# Patient Record
Sex: Female | Born: 1995 | Race: Black or African American | Hispanic: No | Marital: Single | Smoking: Never smoker
Health system: Southern US, Community
[De-identification: ages and names within clinical notes are randomized; demographics above are authoritative.]

## PROBLEM LIST (undated history)

## (undated) DIAGNOSIS — R591 Generalized enlarged lymph nodes: Secondary | ICD-10-CM

## (undated) DIAGNOSIS — R590 Localized enlarged lymph nodes: Secondary | ICD-10-CM

## (undated) DIAGNOSIS — R011 Cardiac murmur, unspecified: Secondary | ICD-10-CM

## (undated) DIAGNOSIS — I10 Essential (primary) hypertension: Secondary | ICD-10-CM

## (undated) DIAGNOSIS — B019 Varicella without complication: Secondary | ICD-10-CM

## (undated) HISTORY — DX: Essential (primary) hypertension: I10

## (undated) HISTORY — DX: Varicella without complication: B01.9

---
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
## 2014-08-17 ENCOUNTER — Encounter (HOSPITAL_COMMUNITY): Payer: Self-pay | Admitting: Emergency Medicine

## 2014-08-17 ENCOUNTER — Emergency Department (HOSPITAL_COMMUNITY)
Admission: EM | Admit: 2014-08-17 | Discharge: 2014-08-17 | Disposition: A | Discharge status: Home / Self Care | Payer: Self-pay | Source: Home / Self Care | Attending: Family Medicine | Admitting: Family Medicine

## 2014-08-17 DIAGNOSIS — M542 Cervicalgia: Secondary | ICD-10-CM

## 2014-08-17 MED ORDER — DICLOFENAC SODIUM 1 % TD GEL: 2.0000 g | Freq: Four times a day (QID) | TRANSDERMAL | Status: DC
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
## 2014-08-17 NOTE — Discharge Instructions (Signed)

## 2015-10-20 ENCOUNTER — Ambulatory Visit (INDEPENDENT_AMBULATORY_CARE_PROVIDER_SITE_OTHER): Payer: BLUE CROSS/BLUE SHIELD | Admitting: Family Medicine

## 2015-10-20 ENCOUNTER — Encounter: Payer: Self-pay | Admitting: Family Medicine

## 2015-10-20 VITALS — BP 140/98 | HR 75 | Temp 98.5°F | Ht 65.0 in | Wt 204.0 lb

## 2015-10-20 DIAGNOSIS — R030 Elevated blood-pressure reading, without diagnosis of hypertension: Secondary | ICD-10-CM

## 2015-10-20 DIAGNOSIS — E669 Obesity, unspecified: Secondary | ICD-10-CM

## 2015-10-20 DIAGNOSIS — I10 Essential (primary) hypertension: Secondary | ICD-10-CM | POA: Insufficient documentation

## 2015-10-20 DIAGNOSIS — IMO0001 Reserved for inherently not codable concepts without codable children: Secondary | ICD-10-CM

## 2015-10-20 NOTE — Patient Instructions (Signed)
Nice to meet you. Your blood pressure is slightly elevated today. We will have you return in the next several weeks for a physical and recheck at that time. If you develop chest pain, shortness of breath, swelling, headaches, or any new or changing symptoms please seek medical attention.

## 2015-10-20 NOTE — Progress Notes (Signed)
Patient ID: Terri Bennett, female   DOB: [birth year], 20 y.o.   MRN: [id number]  Marikay AlarEric Elhadj Girton, MD Phone: [phone]  Terri Bennett is a 20 y.o. female who presents today for new patient visit.  Elevated blood pressure: Patient notes no prior history of hypertension. Notes occasionally will be high at the doctor's office though was normal at other times. No chest pain, shortness breath, or edema. No family history of hypertension. No she has been working on diet and exercise over the last year. Has decreased her portions and has started to eat healthier. Has been exercising sometimes. Was doing a boot camp.  She notes she is due for her yearly physical and Pap smear now that she is 19 and has never had a Pap smear. She prefers to return for this at a later date.  Active Ambulatory Problems    Diagnosis Date Noted  . Elevated blood pressure 10/20/2015  . Obesity 10/20/2015   Resolved Ambulatory Problems    Diagnosis Date Noted  . No Resolved Ambulatory Problems   Past Medical History  Diagnosis Date  . Chickenpox     Family History  Problem Relation Age of Onset  . Breast cancer Maternal Grandmother   . Diabetes    . Lymphoma Mother     Social History   Social History  . Marital Status: Single    Spouse Name: N/A  . Number of Children: N/A  . Years of Education: N/A   Occupational History  . Not on file.   Social History Main Topics  . Smoking status: Never Smoker   . Smokeless tobacco: Not on file  . Alcohol Use: 0.0 oz/week    0 Standard drinks or equivalent per week     Comment: social   . Drug Use: No  . Sexual Activity: Not on file   Other Topics Concern  . Not on file   Social History Narrative    ROS  General:  Negative for nexplained weight loss, fever Skin: Negative for new or changing mole, sore that won't heal HEENT: Negative for trouble hearing, trouble seeing, ringing in ears, mouth sores, hoarseness, change in voice, dysphagia. CV:   Negative for chest pain, dyspnea, edema, palpitations Resp: Negative for cough, dyspnea, hemoptysis GI: Negative for nausea, vomiting, diarrhea, constipation, abdominal pain, melena, hematochezia. GU: Negative for dysuria, incontinence, urinary hesitance, hematuria, vaginal or penile discharge, polyuria, sexual difficulty, lumps in testicle or breasts MSK: Negative for muscle cramps or aches, joint pain or swelling Neuro: Negative for headaches, weakness, numbness, dizziness, passing out/fainting Psych: Negative for depression, anxiety, memory problems  Objective  Physical Exam Filed Vitals:   10/20/15 1041  BP: 140/98  Pulse: 75  Temp: 98.5 F (36.9 C)    BP Readings from Last 3 Encounters:  10/20/15 140/98  08/17/14 143/85   Wt Readings from Last 3 Encounters:  10/20/15 204 lb (92.534 kg)    Physical Exam  Constitutional: She is well-developed, well-nourished, and in no distress.  HENT:  Head: Normocephalic and atraumatic.  Right Ear: External ear normal.  Left Ear: External ear normal.  Mouth/Throat: Oropharynx is clear and moist. No oropharyngeal exudate.  Eyes: Conjunctivae are normal. Pupils are equal, round, and reactive to light.  Cardiovascular: Normal rate, regular rhythm and normal heart sounds.   Pulmonary/Chest: Effort normal and breath sounds normal.  Abdominal: Soft. Bowel sounds are normal. She exhibits no distension. There is no tenderness. There is no rebound and no guarding.  Musculoskeletal: She exhibits  no edema.  Neurological: She is alert. Gait normal.  Skin: Skin is warm and dry. She is not diaphoretic.  Psychiatric: Mood and affect normal.     Assessment/Plan:   Elevated blood pressure Elevated on check today. Elevated on recheck as well. We will have her monitor at home/pharmacy and we'll recheck at her physical and if still elevated consider medication. Given return precautions.  Obesity BMI of 33. She will continue diet and exercise.  Reports she is down about 30 pounds from last year. Advised on diet and exercise.  To return for physical exam at her convenience in the next several weeks    Marikay Alar, MD [facility]

## 2015-10-20 NOTE — Assessment & Plan Note (Addendum)
Elevated on check today. Elevated on recheck as well. We will have her monitor at home/pharmacy and we'll recheck at her physical and if still elevated consider medication. Given return precautions.

## 2015-10-20 NOTE — Progress Notes (Signed)
Pre visit review using our clinic review tool, if applicable. No additional management support is needed unless otherwise documented below in the visit note. 

## 2015-10-20 NOTE — Assessment & Plan Note (Addendum)
BMI of 33. She will continue diet and exercise. Reports she is down about 30 pounds from last year. Advised on diet and exercise.

## 2015-10-24 ENCOUNTER — Ambulatory Visit (INDEPENDENT_AMBULATORY_CARE_PROVIDER_SITE_OTHER): Payer: BLUE CROSS/BLUE SHIELD | Admitting: Family Medicine

## 2015-10-24 ENCOUNTER — Other Ambulatory Visit (HOSPITAL_COMMUNITY)
Admission: RE | Admit: 2015-10-24 | Discharge: 2015-10-24 | Disposition: A | Discharge status: Home / Self Care | Payer: BLUE CROSS/BLUE SHIELD | Source: Ambulatory Visit | Attending: Family Medicine | Admitting: Family Medicine

## 2015-10-24 ENCOUNTER — Encounter: Payer: Self-pay | Admitting: Family Medicine

## 2015-10-24 VITALS — BP 158/102 | HR 94 | Temp 98.4°F | Ht 65.0 in | Wt 208.0 lb

## 2015-10-24 DIAGNOSIS — Z114 Encounter for screening for human immunodeficiency virus [HIV]: Secondary | ICD-10-CM

## 2015-10-24 DIAGNOSIS — Z01419 Encounter for gynecological examination (general) (routine) without abnormal findings: Secondary | ICD-10-CM | POA: Diagnosis present

## 2015-10-24 DIAGNOSIS — E669 Obesity, unspecified: Secondary | ICD-10-CM | POA: Diagnosis not present

## 2015-10-24 DIAGNOSIS — R030 Elevated blood-pressure reading, without diagnosis of hypertension: Secondary | ICD-10-CM | POA: Diagnosis not present

## 2015-10-24 DIAGNOSIS — Z0001 Encounter for general adult medical examination with abnormal findings: Secondary | ICD-10-CM | POA: Insufficient documentation

## 2015-10-24 DIAGNOSIS — Z113 Encounter for screening for infections with a predominantly sexual mode of transmission: Secondary | ICD-10-CM | POA: Diagnosis present

## 2015-10-24 DIAGNOSIS — IMO0001 Reserved for inherently not codable concepts without codable children: Secondary | ICD-10-CM

## 2015-10-24 DIAGNOSIS — Z124 Encounter for screening for malignant neoplasm of cervix: Secondary | ICD-10-CM

## 2015-10-24 LAB — COMPREHENSIVE METABOLIC PANEL
ALT: 13 U/L (ref 0–35)
AST: 14 U/L (ref 0–37)
Albumin: 4 g/dL (ref 3.5–5.2)
Alkaline Phosphatase: 39 U/L (ref 39–117)
BUN: 9 mg/dL (ref 6–23)
CHLORIDE: 103 meq/L (ref 96–112)
CO2: 27 meq/L (ref 19–32)
CREATININE: 0.56 mg/dL (ref 0.40–1.20)
Calcium: 9.4 mg/dL (ref 8.4–10.5)
GFR: 174.08 mL/min (ref >60.00)
GLUCOSE: 79 mg/dL (ref 70–99)
Potassium: 3.7 mEq/L (ref 3.5–5.1)
SODIUM: 136 meq/L (ref 135–145)
Total Bilirubin: 0.4 mg/dL (ref 0.2–1.2)
Total Protein: 7.1 g/dL (ref 6.0–8.3)

## 2015-10-24 LAB — HEMOGLOBIN A1C: Hgb A1c MFr Bld: 5.5 % (ref 4.6–6.5)

## 2015-10-24 NOTE — Assessment & Plan Note (Signed)
Patient's blood pressure does remain elevated. Please see elevated blood pressure for discussion. She is obese. Discussed diet and exercise. Pap smear completed. STD screening completed. HIV screening completed. A1c will be ordered. CMP. Tetanus vaccination likely up-to-date given that she just graduated from college. She will check with her pediatrician's office. Lab work as outlined below.

## 2015-10-24 NOTE — Progress Notes (Signed)
Terri Rumps, MD Phone: [phone]  Terri Bennett is a 20 y.o. female who presents today for physical exam  Overall patient is doing well. Blood pressure remains elevated today. She has not checked at home. She is asymptomatic. In the past she notes it has been elevated when she goes to the doctor. Had been working on her diet and exercise up until recently. 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 No tobacco use. No illicit drug use. Socially drinks alcohol. She is unsure about her last tetanus vaccination. No prior Pap smear. Has menstrual cycles monthly lasting for about 4 days. Is sexually active. Has never had an STD. No prior history of a HIV testing.  Active Ambulatory Problems    Diagnosis Date Noted  . Elevated blood pressure 10/20/2015  . Obesity 10/20/2015  . Encounter for general adult medical examination with abnormal findings 10/24/2015   Resolved Ambulatory Problems    Diagnosis Date Noted  . No Resolved Ambulatory Problems   Past Medical History  Diagnosis Date  . Chickenpox     Family History  Problem Relation Age of Onset  . Breast cancer Maternal Grandmother   . Diabetes    . Lymphoma Mother     Social History   Social History  . Marital Status: Single    Spouse Name: N/A  . Number of Children: N/A  . Years of Education: N/A   Occupational History  . Not on file.   Social History Main Topics  . Smoking status: Never Smoker   . Smokeless tobacco: Not on file  . Alcohol Use: 0.0 oz/week    0 Standard drinks or equivalent per week     Comment: social   . Drug Use: No  . Sexual Activity: Not on file   Other Topics Concern  . Not on file   Social History Narrative    ROS  General:  Negative for nexplained weight loss, fever Skin: Negative for new or changing mole, sore that won't heal HEENT: Negative for trouble hearing, trouble seeing, ringing in ears, mouth sores, hoarseness, change in voice,  dysphagia. CV:  Negative for chest pain, dyspnea, edema, palpitations Resp: Negative for cough, dyspnea, hemoptysis GI: Negative for nausea, vomiting, diarrhea, constipation, abdominal pain, melena, hematochezia. GU: Negative for dysuria, incontinence, urinary hesitance, hematuria, vaginal or penile discharge, polyuria, sexual difficulty, lumps in testicle or breasts MSK: Negative for muscle cramps or aches, joint pain or swelling Neuro: Negative for headaches, weakness, numbness, dizziness, passing out/fainting Psych: Negative for depression, anxiety, memory problems  Objective  Physical Exam Filed Vitals:   10/24/15 1331 10/24/15 1500  BP: 168/100 158/102  Pulse: 94   Temp: 98.4 F (36.9 C)     BP Readings from Last 3 Encounters:  10/24/15 158/102  10/20/15 140/98  08/17/14 143/85   Wt Readings from Last 3 Encounters:  10/24/15 208 lb (94.348 kg)  10/20/15 204 lb (92.534 kg)    Physical Exam  Constitutional: She is well-developed, well-nourished, and in no distress.  HENT:  Head: Normocephalic and atraumatic.  Right Ear: External ear normal.  Left Ear: External ear normal.  Mouth/Throat: Oropharynx is clear and moist. No oropharyngeal exudate.  Eyes: Conjunctivae are normal. Pupils are equal, round, and reactive to light.  Cardiovascular: Normal rate, regular rhythm and normal heart sounds.   Pulmonary/Chest: Effort normal and breath sounds normal.  Abdominal: Soft. Bowel sounds are normal. She exhibits no distension. There is no tenderness. There is  no rebound and no guarding.  Genitourinary: Vagina normal, uterus normal, cervix normal, right adnexa normal and left adnexa normal.  Musculoskeletal: She exhibits no edema.  Neurological: She is alert. Gait normal.  Skin: Skin is warm and dry. She is not diaphoretic.  Psychiatric: Mood and affect normal.     Assessment/Plan:   Encounter for general adult medical examination with abnormal findings Patient's blood  pressure does remain elevated. Please see elevated blood pressure for discussion. She is obese. Discussed diet and exercise. Pap smear completed. STD screening completed. HIV screening completed. A1c will be ordered. CMP. Tetanus vaccination likely up-to-date given that she just graduated from college. She will check with her pediatrician's office. Lab work as outlined below.  Elevated blood pressure Elevated today. Elevated on recheck as well. Was elevated last week. Discussed option to start medication today versus monitoring at home with daily checks and returning in one week for nurse visit. Patient opted to monitor. Advised that if it was greater than 180/110 she should seek medical attention. Goal is less than 140/90. Likely start on HCTZ if remains elevated at next check.    Orders Placed This Encounter  Procedures  . HIV antibody (with reflex)  . HgB A1c  . Comp Met (CMET)    No orders of the defined types were placed in this encounter.     Terri Rumps, MD [facility]

## 2015-10-24 NOTE — Assessment & Plan Note (Addendum)
Elevated today. Elevated on recheck as well. Was elevated last week. Discussed option to start medication today versus monitoring at home with daily checks and returning in one week for nurse visit. Patient opted to monitor. Advised that if it was greater than 180/110 she should seek medical attention. Goal is less than 140/90. Likely start on HCTZ if remains elevated at next check.

## 2015-10-24 NOTE — Patient Instructions (Addendum)
Nice to see you. We did a Pap smear today and we will call you in several days with the results. You may have some spotting and cramping with this. Please continue to work on diet and exercise. We will obtain some lab work and call with the results. Please monitor your blood pressure at home and record this daily. Please bring the recording in for your nurse visit in the next 1-2 weeks. If you develop chest pain, shortness of breath, headaches, blood pressure greater than 180/110, or any new or changing symptoms please seek medical attention.

## 2015-10-24 NOTE — Progress Notes (Signed)
Pre visit review using our clinic review tool, if applicable. No additional management support is needed unless otherwise documented below in the visit note. 

## 2015-10-25 LAB — HIV ANTIBODY (ROUTINE TESTING W REFLEX): HIV: NONREACTIVE

## 2015-10-25 LAB — CYTOLOGY - PAP

## 2015-11-14 ENCOUNTER — Telehealth: Payer: Self-pay | Admitting: Family Medicine

## 2015-11-14 NOTE — Telephone Encounter (Signed)
LM for patient to call the office. She needs to schedule nurse visit for a TB skin test before we can fill out the paper.

## 2015-11-14 NOTE — Telephone Encounter (Signed)
Pt dropped off a health examination certificate for school,  she would like Dr.Sonnenberg to fill out for school. Paper is located up front in color folder.

## 2015-11-23 ENCOUNTER — Ambulatory Visit (INDEPENDENT_AMBULATORY_CARE_PROVIDER_SITE_OTHER): Payer: BLUE CROSS/BLUE SHIELD

## 2015-11-23 DIAGNOSIS — Z111 Encounter for screening for respiratory tuberculosis: Secondary | ICD-10-CM

## 2015-11-23 NOTE — Progress Notes (Signed)
Patient came in for a TB skin test.  Received on left forearm.  Patient was instructed to return on Friday after 1030 to have it read, she would like to have her BP rechecked then per the PCP note, needs to be done.

## 2015-11-25 ENCOUNTER — Ambulatory Visit: Payer: BLUE CROSS/BLUE SHIELD

## 2015-11-25 VITALS — BP 162/102 | HR 78

## 2015-11-25 DIAGNOSIS — Z111 Encounter for screening for respiratory tuberculosis: Secondary | ICD-10-CM

## 2015-11-25 LAB — TB SKIN TEST
INDURATION: 0 mm
TB SKIN TEST: NEGATIVE

## 2015-11-25 NOTE — Progress Notes (Signed)
Patient came in for BP check and to fill out paperwork for Job.  Patient needed a hearing and eye exam. Also PPD Check.  Which has been completed.  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  Patient stated that her BP has been elevated in the past but no SX of chest pain or discomfort what so ever.  Patient has been monitoring her BP at home.  Patient will be FU with Dr. Birdie SonsSonnenberg for BP at a later date.  At that appointment she will bring her readings with her.

## 2015-11-25 NOTE — Telephone Encounter (Signed)
Form filled out and given to patient

## 2015-11-25 NOTE — Telephone Encounter (Signed)
Verbal order given to do a hearing and vision screening by Dr. Birdie SonsSonnenberg. Also patient is having BP check today as well. Verbal order given for this as well.

## 2015-12-24 ENCOUNTER — Other Ambulatory Visit: Payer: Self-pay | Admitting: Family Medicine

## 2016-02-02 ENCOUNTER — Telehealth: Payer: Self-pay | Admitting: Family Medicine

## 2016-02-02 NOTE — Telephone Encounter (Signed)
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  Pt  Contact [phone]  Fax [phone] Attn: Sydell AxonJessica Lolley

## 2016-02-02 NOTE — Telephone Encounter (Signed)
Pt lvm stating that she had brought forms in to be filled out for her employer and was suppose to of been faxed. She states that they have not been received them yet.  States she needs them faxed in by the 15th or they are going to hold her check. She states that if she needs to bring in another copy of her forms then she will.  She did not say when they were brought or faxed in. She request a call back [phone]

## 2016-02-03 NOTE — Telephone Encounter (Signed)
Form faxed again 02/03/16

## 2016-02-03 NOTE — Telephone Encounter (Signed)
Form faxed

## 2016-10-15 ENCOUNTER — Ambulatory Visit (INDEPENDENT_AMBULATORY_CARE_PROVIDER_SITE_OTHER): Payer: BLUE CROSS/BLUE SHIELD | Admitting: Family Medicine

## 2016-10-15 ENCOUNTER — Encounter: Payer: Self-pay | Admitting: Family Medicine

## 2016-10-15 VITALS — BP 164/112 | HR 77 | Temp 98.5°F | Wt 218.2 lb

## 2016-10-15 DIAGNOSIS — I10 Essential (primary) hypertension: Secondary | ICD-10-CM

## 2016-10-15 DIAGNOSIS — N926 Irregular menstruation, unspecified: Secondary | ICD-10-CM | POA: Diagnosis not present

## 2016-10-15 DIAGNOSIS — R42 Dizziness and giddiness: Secondary | ICD-10-CM | POA: Diagnosis not present

## 2016-10-15 LAB — COMPREHENSIVE METABOLIC PANEL
ALT: 14 U/L (ref 0–35)
AST: 18 U/L (ref 0–37)
Albumin: 4 g/dL (ref 3.5–5.2)
Alkaline Phosphatase: 40 U/L (ref 39–117)
BUN: 10 mg/dL (ref 6–23)
CO2: 26 meq/L (ref 19–32)
Calcium: 9.6 mg/dL (ref 8.4–10.5)
Chloride: 102 mEq/L (ref 96–112)
Creatinine, Ser: 0.72 mg/dL (ref 0.40–1.20)
GFR: 129.11 mL/min (ref >60.00)
Glucose, Bld: 90 mg/dL (ref 70–99)
POTASSIUM: 3.9 meq/L (ref 3.5–5.1)
Sodium: 134 mEq/L — ABNORMAL LOW (ref 135–145)
Total Bilirubin: 0.4 mg/dL (ref 0.2–1.2)
Total Protein: 7.9 g/dL (ref 6.0–8.3)

## 2016-10-15 LAB — CBC
HCT: 40.9 % (ref 36.0–46.0)
HEMOGLOBIN: 13.9 g/dL (ref 12.0–15.0)
MCHC: 33.9 g/dL (ref 30.0–36.0)
MCV: 88.9 fl (ref 78.0–100.0)
PLATELETS: 272 10*3/uL (ref 150.0–400.0)
RBC: 4.6 Mil/uL (ref 3.87–5.11)
RDW: 13.2 % (ref 11.5–15.5)
WBC: 5.8 10*3/uL (ref 4.0–10.5)

## 2016-10-15 LAB — POCT URINE PREGNANCY: Preg Test, Ur: NEGATIVE

## 2016-10-15 LAB — TSH: TSH: 3.3 u[IU]/mL (ref 0.35–4.50)

## 2016-10-15 MED ORDER — AMLODIPINE BESYLATE 5 MG PO TABS: 5.0000 mg | ORAL_TABLET | Freq: Every day | ORAL | 3 refills | Status: DC

## 2016-10-15 NOTE — Assessment & Plan Note (Signed)
Patient with intermittent bleeding. Suspect related to her Sprintec or recent weight gain. Normal pelvic exam today. We will check TSH and CBC. Urine pregnancy test is negative. Determine if any further workup needed following labs.

## 2016-10-15 NOTE — Patient Instructions (Addendum)
Nice to see you. We will check lab work today and contact you with the results. We'll start you on amlodipine for your blood pressure. I would like you to return in 2 weeks for a blood pressure check with our nurse. Please avoid being around paints and paint fumes as I believe this is leading to your symptoms of sweating and nausea. If you start to have these symptoms in any other setting you need to let us know. If you develop heavy vaginal bleeding, chest pain, shortness of breath, or any new or changing symptoms please seek medical attention.

## 2016-10-15 NOTE — Assessment & Plan Note (Signed)
Blood pressure elevated today. Has been persistently elevated. Discussed the risk of persistently elevated blood pressure with the patient. We will start her on amlodipine. We'll check lab work as outlined below. Follow-up in 2 weeks for recheck.

## 2016-10-15 NOTE — Progress Notes (Signed)
Tommi Rumps, MD Phone: [phone]  Terri Bennett is a 21 y.o. female who presents today for same-day visit.  Intermenstrual bleeding: Patient notes over the last 2 months she's had seldom spotting between her menstrual cycles. Typically has one. Month lasting 4-6 days. On several occasion she's had light spotting when she wipes after urinating and when she wipes after being in the shower. She has not noticed any blood in her underwear. She does note she lost some weight and then gained it back. She notes no vision issues. No thyroid issues. She is on Sprintec and has not had any issues with this. She notes no hematuria. She is sexually active with one partner. No vaginal discharge.  Hypertension: Not checking blood pressure. No chest pain, shortness breath, or edema. Not on any medication.  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 She notes these 2 episodes were separated by a number of months. She notes no chest pain. No syncope. She notes the symptoms resolve fairly quickly. She does not have symptoms when she drinks wine outside of one of these classes. She wonders if it would warrant allergy testing or if she is reacting to the paint.  PMH: nonsmoker.   ROS see history of present illness  Objective  Physical Exam Vitals:   10/15/16 0948 10/15/16 1010  BP: (!) 166/120 (!) 164/112  Pulse: 77   Temp: 98.5 F (36.9 C)     BP Readings from Last 3 Encounters:  10/15/16 (!) 164/112  11/25/15 (!) 162/102  10/24/15 (!) 158/102   Wt Readings from Last 3 Encounters:  10/15/16 218 lb 3.2 oz (99 kg)  10/24/15 208 lb (94.3 kg)  10/20/15 204 lb (92.5 kg)    Physical Exam  Constitutional: No distress.  Cardiovascular: Normal rate, regular rhythm and normal heart sounds.   Pulmonary/Chest: Effort normal and breath sounds normal.  Abdominal: Soft. Bowel  sounds are normal. She exhibits no distension. There is no tenderness.  Genitourinary:  Genitourinary Comments: Normal labia, normal vaginal mucosa with no lesions noted, normal cervix, normal bimanual exam with no cervical motion tenderness or adnexal tenderness, no vaginal discharge or bleeding noted  Musculoskeletal: She exhibits no edema.  Skin: She is not diaphoretic.     Assessment/Plan: Please see individual problem list.  Essential hypertension Blood pressure elevated today. Has been persistently elevated. Discussed the risk of persistently elevated blood pressure with the patient. We will start her on amlodipine. We'll check lab work as outlined below. Follow-up in 2 weeks for recheck.  Irregular menstrual bleeding Patient with intermittent bleeding. Suspect related to her Sprintec or recent weight gain. Normal pelvic exam today. We will check TSH and CBC. Urine pregnancy test is negative. Determine if any further workup needed following labs.  Lightheadedness Patient notes 2 episodes of lightheadedness with some nausea and sweatiness separated by a number of months. 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 She had also been drinking wine though has not had this issue when when she drinks wine at other times. No syncope. No chest pain. Has not occurred in any other setting. Discussed that I suspect this will be related to the paint fumes given the isolated episodes. Advised her to avoid paint fumes. She'll monitor and if she has any recurrence in any other setting she will let us know for further  evaluation.   Orders Placed This Encounter  Procedures  . TSH  . Comp Met (CMET)  . CBC  . POCT urine pregnancy    Meds ordered this encounter  Medications  . amLODipine (NORVASC) 5 MG tablet    Sig: Take 1 tablet (5 mg total) by mouth daily.    Dispense:  90 tablet    Refill:  [place], MD Isabel

## 2016-10-15 NOTE — Assessment & Plan Note (Signed)
Patient notes 2 episodes of lightheadedness with some nausea and sweatiness separated by a number of months. 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 She had also been drinking wine though has not had this issue when when she drinks wine at other times. No syncope. No chest pain. Has not occurred in any other setting. Discussed that I suspect this will be related to the paint fumes given the isolated episodes. Advised her to avoid paint fumes. She'll monitor and if she has any recurrence in any other setting she will let us know for further evaluation.

## 2016-10-31 ENCOUNTER — Encounter: Payer: Self-pay | Admitting: *Deleted

## 2016-10-31 ENCOUNTER — Ambulatory Visit (INDEPENDENT_AMBULATORY_CARE_PROVIDER_SITE_OTHER): Payer: BLUE CROSS/BLUE SHIELD | Admitting: *Deleted

## 2016-10-31 VITALS — BP 140/96 | HR 78 | Resp 18

## 2016-10-31 DIAGNOSIS — I10 Essential (primary) hypertension: Secondary | ICD-10-CM | POA: Diagnosis not present

## 2016-10-31 MED ORDER — NORGESTIMATE-ETH ESTRADIOL 0.25-35 MG-MCG PO TABS: 1.0000 | ORAL_TABLET | Freq: Every day | ORAL | 1 refills | Status: DC

## 2016-10-31 NOTE — Progress Notes (Signed)
BP improved though not at goal. I would suggest increasing the amlodipine to 10 mg daily. I can send in a new prescription in for her if needed. Sprintec sent to pharmacy.  Marikay AlarEric Sonnenberg, M.D.

## 2016-10-31 NOTE — Progress Notes (Signed)
Patient presented for 2 week BP check after starting amlodipine 5 mg, last dose 9:30 am today. BP left arm 148/98 pulse 76, right arm 140/96 pulse 78.   Patient stated while in office she is scheduled for CPE in October but needs Reill on Sprintec to last until  CPE ok to fill for 90  Days.

## 2016-10-31 NOTE — Addendum Note (Signed)
Addended by: Glori LuisSONNENBERG, Kejuan Bekker G on: 10/31/2016 05:42 PM   Modules accepted: Orders

## 2016-11-01 NOTE — Progress Notes (Signed)
It could potentially cause her blood pressure to go up. We could try an alternative birth control method. These would include progesterone only pills, Implanon, and IUD. We can provide her with information on these. The Implanon and IUD would require referral to gynecology.

## 2016-11-01 NOTE — Progress Notes (Signed)
Patient staed she would like to talk with her mother before increasing her BP medication, she would call the office and let us know what she decides, Patient ask if her birth control could cause the increase in BP she staed when she went on birth control was when she started having elevated BP.

## 2016-11-02 NOTE — Progress Notes (Signed)
Patient returned call, advised patient of PCP advice and she stated, she would investigate the alternatives and discuss with her mother and call the office when she decides what she would like to try,

## 2016-11-02 NOTE — Progress Notes (Signed)
Left message for patient to return call  To office. 

## 2017-01-17 ENCOUNTER — Ambulatory Visit: Payer: BLUE CROSS/BLUE SHIELD | Admitting: Family Medicine

## 2017-04-29 ENCOUNTER — Telehealth: Payer: Self-pay | Admitting: *Deleted

## 2017-04-29 ENCOUNTER — Ambulatory Visit: Payer: Self-pay | Admitting: Family Medicine

## 2017-04-29 NOTE — Telephone Encounter (Signed)
Copied from CRM [id number]. Topic: Quick Communication - Appointment Cancellation >> Apr 29, 2017  8:26 AM Terri Bennett, Cheri B wrote: Patient called to cancel appointment scheduled for today 1:30  Patient has rescheduled their appointment.    Route to department's PEC pool.

## 2017-04-29 NOTE — Telephone Encounter (Signed)
Noted  

## 2017-04-29 NOTE — Telephone Encounter (Signed)
fyi

## 2017-05-05 ENCOUNTER — Other Ambulatory Visit: Payer: Self-pay | Admitting: Family Medicine

## 2017-05-06 ENCOUNTER — Ambulatory Visit: Payer: BC Managed Care – PPO | Admitting: Family Medicine

## 2017-05-06 ENCOUNTER — Encounter: Payer: Self-pay | Admitting: Family Medicine

## 2017-05-06 ENCOUNTER — Other Ambulatory Visit: Payer: Self-pay

## 2017-05-06 DIAGNOSIS — E6609 Other obesity due to excess calories: Secondary | ICD-10-CM

## 2017-05-06 DIAGNOSIS — I10 Essential (primary) hypertension: Secondary | ICD-10-CM

## 2017-05-06 DIAGNOSIS — Z309 Encounter for contraceptive management, unspecified: Secondary | ICD-10-CM | POA: Diagnosis not present

## 2017-05-06 MED ORDER — NORETHINDRONE 0.35 MG PO TABS: 1.0000 | ORAL_TABLET | Freq: Every day | ORAL | 11 refills | Status: DC

## 2017-05-06 NOTE — Assessment & Plan Note (Signed)
Discussed diet and exercise 

## 2017-05-06 NOTE — Assessment & Plan Note (Signed)
Somewhat improved control.  The patient and I suspect her OCPs are possibly contributing to this.  We will discontinue her current combined contraceptive pill.  I discussed coming back in 1 week for blood pressure check.  Continue amlodipine.

## 2017-05-06 NOTE — Patient Instructions (Addendum)
Nice to see you. We will switch her birth control.  We will see if this helps with your blood pressure.  We will have you return in 1 week for blood pressure check. Please make sure you take the birth control every day at the same time.  If you miss a dose you need to use a backup method of contraception if you become sexually active such as a condom.  If you get to the point where you not having cycle please let us know.

## 2017-05-06 NOTE — Assessment & Plan Note (Signed)
Given blood pressure issues we are going to have her come off of her combined oral contraceptive pills.  I discussed multiple options for progesterone birth control and she opted for the progesterone only pill.  I discussed with our clinical pharmacist regarding when to start this.  The patient is on her last day of her menstrual cycle and can start it today.  I advised the patient that she needs to take this daily at the same time to ensure that she does not get pregnant if she were to become sexually active again.  If she misses a dose she needs to use condoms for contraception.

## 2017-05-06 NOTE — Progress Notes (Signed)
  Marikay AlarEric Sonnenberg, MD Phone: [phone]  Terri Bennett is a 22 y.o. female who presents today for f/u.  Hypertension: Not checking at home.  Taking amlodipine.  No chest pain, shortness of breath, or edema.  Patient has been on OCPs for some time now.  She is on combined estrogen progesterone tablets.  She has a cycle monthly with this lasting for 4 days with varying heaviness.  No intermenstrual bleeding.  She is not currently sexually active though has been sexually active in the past.  Obesity: She remains active though no specific exercise.  She is cut down on fried foods.  She is lost 13 pounds since her last visit.  Social History   Tobacco Use  Smoking Status Never Smoker  Smokeless Tobacco Never Used     ROS see history of present illness  Objective  Physical Exam Vitals:   05/06/17 1528  BP: (!) 158/92  Pulse: 86  Temp: 98.3 F (36.8 C)  SpO2: 99%    BP Readings from Last 3 Encounters:  05/06/17 (!) 158/92  10/31/16 (!) 140/96  10/15/16 (!) 164/112   Wt Readings from Last 3 Encounters:  05/06/17 205 lb 6.4 oz (93.2 kg)  10/15/16 218 lb 3.2 oz (99 kg)  10/24/15 208 lb (94.3 kg)    Physical Exam  Constitutional: No distress.  Cardiovascular: Normal rate, regular rhythm and normal heart sounds.  Pulmonary/Chest: Effort normal and breath sounds normal.  Musculoskeletal: She exhibits no edema.  Neurological: She is alert. Gait normal.  Skin: Skin is warm and dry. She is not diaphoretic.     Assessment/Plan: Please see individual problem list.  Essential hypertension Somewhat improved control.  The patient and I suspect her OCPs are possibly contributing to this.  We will discontinue her current combined contraceptive pill.  I discussed coming back in 1 week for blood pressure check.  Continue amlodipine.  Contraceptive management Given blood pressure issues we are going to have her come off of her combined oral contraceptive pills.  I discussed  multiple options for progesterone birth control and she opted for the progesterone only pill.  I discussed with our clinical pharmacist regarding when to start this.  The patient is on her last day of her menstrual cycle and can start it today.  I advised the patient that she needs to take this daily at the same time to ensure that she does not get pregnant if she were to become sexually active again.  If she misses a dose she needs to use condoms for contraception.  Obesity Discussed diet and exercise.   No orders of the defined types were placed in this encounter.   Meds ordered this encounter  Medications  . norethindrone (ORTHO MICRONOR) 0.35 MG tablet    Sig: Take 1 tablet (0.35 mg total) by mouth daily.    Dispense:  1 Package    Refill:  11     Marikay AlarEric Sonnenberg, MD [facility] [place]

## 2017-05-15 ENCOUNTER — Ambulatory Visit (INDEPENDENT_AMBULATORY_CARE_PROVIDER_SITE_OTHER): Payer: BC Managed Care – PPO | Admitting: *Deleted

## 2017-05-15 VITALS — BP 158/96 | HR 62 | Resp 20

## 2017-05-15 DIAGNOSIS — I10 Essential (primary) hypertension: Secondary | ICD-10-CM | POA: Diagnosis not present

## 2017-05-15 NOTE — Progress Notes (Signed)
Patient in for one week follow up on blood pressure after stopping OCP's , and taking amlodipine 5 mg , patient BP taken 15 minutes after arrival 158/96 pulse 62.

## 2017-05-15 NOTE — Progress Notes (Signed)
Please let the patient know her blood pressure remains uncontrolled.  I would suggest increasing her amlodipine to 10 mg daily and rechecking in 1 month.  Thanks.

## 2017-05-17 NOTE — Progress Notes (Signed)
Left message for patient to return call to office. 

## 2017-06-03 ENCOUNTER — Telehealth: Payer: Self-pay | Admitting: Family Medicine

## 2017-06-03 NOTE — Telephone Encounter (Signed)
Wrong office- [facility].

## 2017-06-03 NOTE — Telephone Encounter (Signed)
Copied from CRM [id number]. Topic: Quick Communication - See Telephone Encounter >> Jun 03, 2017  2:39 PM Lelon FrohlichGolden, Tashia, RMA wrote: CRM for notification. See Telephone encounter for:   06/03/17.pt called to request to be placed back on her old birth control medication (sprintec)pt stated that her current medication is causing acne and an allergic reaction

## 2017-06-03 NOTE — Telephone Encounter (Signed)
Patient notified Dr.Sonnenberg is out of office and will address once he is back.

## 2017-06-04 NOTE — Telephone Encounter (Signed)
Left message to return call, ok for pec to speak with patient about message below.  Noted. Please find out what kind of reaction she has had. Please also advise her to discontinue the medication given the possible allergic reaction. Can you find out what her blood pressure has been running as well with the change in medications? That will help determine if placing her back on sprintec is appropriate. Thanks.

## 2017-06-04 NOTE — Telephone Encounter (Signed)
Message left to call back and discuss what kind of reaction she was having and to find out what her blood pressure has been running.

## 2017-06-04 NOTE — Telephone Encounter (Signed)
Noted. Please find out what kind of reaction she has had. Please also advise her to discontinue the medication given the possible allergic reaction. Can you find out what her blood pressure has been running as well with the change in medications? That will help determine if placing her back on sprintec is appropriate. Thanks.

## 2017-06-11 ENCOUNTER — Telehealth: Payer: Self-pay | Admitting: Family Medicine

## 2017-06-11 NOTE — Telephone Encounter (Signed)
fyi

## 2017-06-11 NOTE — Telephone Encounter (Signed)
Copied from CRM [id number]. Topic: Quick Communication - See Telephone Encounter >> Jun 04, 2017  8:55 AM Inetta FermoHendricks, Jessica S, CMA wrote: CRM for notification. See Telephone encounter for:  Left message to return call, ok for pec to speak with patient about message below.  Noted. Please find out what kind of reaction she has had. Please also advise her to discontinue the medication given the possible allergic reaction. Can you find out what her blood pressure has been running as well with the change in medications? That will help determine if placing her back on sprintec is appropriate. Thanks. 06/04/17. >> Jun 10, 2017 10:18 AM Lelon FrohlichGolden, Tashia, RMA wrote: Pt called back and stated that she has had some dryness on her face and under her eyes and acne and pt stated that she has stopped taking the birth control and symptoms have cleared up. Pt would like to switch back to her spritec birth control please advice

## 2017-06-11 NOTE — Telephone Encounter (Signed)
I additionally need to know what her BP has been running. They did not ask that as I directed. I need to know this to see if stopping the combined OCP made a difference for her BP. Thanks.

## 2017-06-11 NOTE — Telephone Encounter (Signed)
See other message

## 2017-06-12 NOTE — Telephone Encounter (Signed)
Patient states she has not checked it but will check it today and call back with readings

## 2017-07-09 NOTE — Progress Notes (Signed)
See phone note from 06/08/17

## 2017-08-05 ENCOUNTER — Other Ambulatory Visit: Payer: Self-pay

## 2017-08-05 ENCOUNTER — Encounter: Payer: Self-pay | Admitting: Family Medicine

## 2017-08-05 ENCOUNTER — Ambulatory Visit: Payer: BC Managed Care – PPO | Admitting: Family Medicine

## 2017-08-05 VITALS — BP 160/90 | HR 68 | Temp 98.1°F | Ht 65.0 in | Wt 195.4 lb

## 2017-08-05 DIAGNOSIS — Z309 Encounter for contraceptive management, unspecified: Secondary | ICD-10-CM

## 2017-08-05 DIAGNOSIS — E6609 Other obesity due to excess calories: Secondary | ICD-10-CM | POA: Diagnosis not present

## 2017-08-05 DIAGNOSIS — Z6832 Body mass index (BMI) 32.0-32.9, adult: Secondary | ICD-10-CM | POA: Diagnosis not present

## 2017-08-05 DIAGNOSIS — I10 Essential (primary) hypertension: Secondary | ICD-10-CM | POA: Diagnosis not present

## 2017-08-05 MED ORDER — AMLODIPINE BESYLATE 5 MG PO TABS: 5.0000 mg | ORAL_TABLET | Freq: Every day | ORAL | 3 refills | Status: DC

## 2017-08-05 NOTE — Assessment & Plan Note (Signed)
Given blood pressure I do not believe Sprintec is the best option for her contraceptive management given risks.  Discussed potential options with Depo-Provera, IUD, or Implanon versus referral to GYN to discuss this further.  She opted for GYN referral.  Referral was placed.

## 2017-08-05 NOTE — Assessment & Plan Note (Signed)
Uncontrolled.  Discussed need to go back on medication.  We will restart amlodipine.  She will start checking her blood pressure at home.  She will return in 2 weeks for recheck.

## 2017-08-05 NOTE — Patient Instructions (Signed)
Nice to see you. Please start back on the amlodipine.  Please check your blood pressure daily for the next 2 weeks and then we will check it again in the office. I referred you to GYN to discuss contraception management.

## 2017-08-05 NOTE — Progress Notes (Signed)
  Marikay Alar, MD Phone: [phone]  Terri Bennett is a 22 y.o. female who presents today for f/u.  HYPERTENSION  Disease Monitoring  Home BP Monitoring not checking consistently Chest pain- no    Dyspnea- no Medications  Compliance-  Not taking medication.   Edema- no  Obesity: She has been working on exercise by going to the gym and doing high intensity workouts 2 times a week.  She is eating healthier with salads and chicken.  She is down 10 pounds.  Contraception management: She has a menstrual cycle once monthly lasting about 5 days.  She has had no intermenstrual bleeding.  She did not tolerate the progesterone only pill and notes she had an acne outbreak and it dried out her skin.  She would like to go back on Sprintec though her blood pressure is uncontrolled.    Social History   Tobacco Use  Smoking Status Never Smoker  Smokeless Tobacco Never Used     ROS see history of present illness  Objective  Physical Exam Vitals:   08/05/17 1610  BP: (!) 160/90  Pulse: 68  Temp: 98.1 F (36.7 C)  SpO2: 98%    BP Readings from Last 3 Encounters:  08/05/17 (!) 160/90  05/15/17 (!) 158/96  05/06/17 (!) 158/92   Wt Readings from Last 3 Encounters:  08/05/17 195 lb 6.4 oz (88.6 kg)  05/06/17 205 lb 6.4 oz (93.2 kg)  10/15/16 218 lb 3.2 oz (99 kg)    Physical Exam  Constitutional: No distress.  Cardiovascular: Normal rate, regular rhythm and normal heart sounds.  Pulmonary/Chest: Effort normal and breath sounds normal.  Musculoskeletal: She exhibits no edema.  Neurological: She is alert.  Skin: Skin is warm and dry. She is not diaphoretic.     Assessment/Plan: Please see individual problem list.  Contraceptive management Given blood pressure I do not believe Sprintec is the best option for her contraceptive management given risks.  Discussed potential options with Depo-Provera, IUD, or Implanon versus referral to GYN to discuss this further.  She opted  for GYN referral.  Referral was placed.  Essential hypertension Uncontrolled.  Discussed need to go back on medication.  We will restart amlodipine.  She will start checking her blood pressure at home.  She will return in 2 weeks for recheck.  Obesity Patient has lost an additional 10 pounds.  I congratulated her on this.  She will continue diet and exercise.   Orders Placed This Encounter  Procedures  . Ambulatory referral to Gynecology    Referral Priority:   Routine    Referral Type:   Consultation    Referral Reason:   Specialty Services Required    Requested Specialty:   Gynecology    Number of Visits Requested:   1    Meds ordered this encounter  Medications  . amLODipine (NORVASC) 5 MG tablet    Sig: Take 1 tablet (5 mg total) by mouth daily.    Dispense:  90 tablet    Refill:  3     Marikay Alar, MD [facility]

## 2017-08-05 NOTE — Assessment & Plan Note (Signed)
Patient has lost an additional 10 pounds.  I congratulated her on this.  She will continue diet and exercise.

## 2017-08-20 ENCOUNTER — Ambulatory Visit: Payer: BC Managed Care – PPO

## 2017-09-20 ENCOUNTER — Other Ambulatory Visit: Payer: Self-pay | Admitting: Family Medicine

## 2017-09-20 ENCOUNTER — Telehealth: Payer: Self-pay | Admitting: Family Medicine

## 2017-09-20 DIAGNOSIS — I10 Essential (primary) hypertension: Secondary | ICD-10-CM

## 2017-09-20 NOTE — Telephone Encounter (Signed)
Received call from Dr. Cherly Hensenousins.  Patient was referred there to discuss contraceptive management given uncontrolled blood pressure.  Dr. Cherly Hensenousins voiced concern regarding her uncontrolled blood pressure and her age with no family history of hypertension.  She wondered about having secondary work-up completed.  The patient has not been taking her blood pressure medication.  We will refer to cardiology to consider secondary HTN work up. Patient is obese and african [place] which places her at risk for HTN.

## 2017-10-28 ENCOUNTER — Telehealth: Payer: Self-pay

## 2017-10-28 NOTE — Telephone Encounter (Signed)
Patient was referred to cardiology. They have been unable to reach patient to schedule. Left message to return call. Ok for pec to speak to patient and inform her to call cardiology to schedule appointment. Please give number [phone]

## 2017-11-12 NOTE — Telephone Encounter (Signed)
Left message to return call, ok for pec to speak to patient and give message below

## 2017-11-14 NOTE — Telephone Encounter (Signed)
Patient is scheduled   

## 2017-11-19 ENCOUNTER — Encounter: Payer: Self-pay | Admitting: Family Medicine

## 2017-11-19 ENCOUNTER — Ambulatory Visit: Payer: BC Managed Care – PPO | Admitting: Family Medicine

## 2017-11-19 VITALS — BP 150/108 | HR 79 | Temp 97.8°F | Wt 197.0 lb

## 2017-11-19 DIAGNOSIS — I10 Essential (primary) hypertension: Secondary | ICD-10-CM | POA: Diagnosis not present

## 2017-11-19 DIAGNOSIS — R229 Localized swelling, mass and lump, unspecified: Secondary | ICD-10-CM

## 2017-11-19 MED ORDER — AMLODIPINE BESYLATE 10 MG PO TABS
10.0000 mg | ORAL_TABLET | Freq: Every day | ORAL | 1 refills | Status: DC
Start: 2017-11-19 — End: 2018-12-19

## 2017-11-19 NOTE — Progress Notes (Signed)
  Marikay AlarEric Lizzete Gough, MD Phone: [phone]  Terri Bennett is a 22 y.o. female who presents today for f/u.  CC: right neck nodule, htn  HYPERTENSION  Disease Monitoring  Home BP Monitoring not checking Chest pain- no    Dyspnea- no Medications  Compliance-  Taking amlodipine 5 mg.  Edema- no She did see gynecology and they suggested referral for secondary hypertension work-up.  Patient has been referred to the appointment is not until October. No family history of early HTN.   Patient reports a nodule in her right posterior neck that is been present for about a year.  It does not hurt.  It has not grown.  She has had no unintentional weight loss.  No night sweats.    Social History   Tobacco Use  Smoking Status Never Smoker  Smokeless Tobacco Never Used     ROS see history of present illness  Objective  Physical Exam Vitals:   11/19/17 1335 11/19/17 1348  BP: (!) 160/110 (!) 150/108  Pulse: 79   Temp: 97.8 F (36.6 C)   SpO2: 98%     BP Readings from Last 3 Encounters:  11/19/17 (!) 150/108  08/05/17 (!) 160/90  05/15/17 (!) 158/96   Wt Readings from Last 3 Encounters:  11/19/17 197 lb (89.4 kg)  08/05/17 195 lb 6.4 oz (88.6 kg)  05/06/17 205 lb 6.4 oz (93.2 kg)    Physical Exam  Constitutional: No distress.  Neck:    Cardiovascular: Normal rate, regular rhythm and normal heart sounds.  Pulmonary/Chest: Effort normal and breath sounds normal.  Musculoskeletal: She exhibits no edema.  Lymphadenopathy:       Head (right side): No submental, no submandibular and no posterior auricular adenopathy present.       Head (left side): No submental, no submandibular and no posterior auricular adenopathy present.    She has no axillary adenopathy.       Right: No inguinal and no supraclavicular adenopathy present.       Left: No inguinal and no supraclavicular adenopathy present.  Neurological: She is alert.  Skin: Skin is warm and dry. She is not diaphoretic.      Assessment/Plan: Please see individual problem list.  Essential hypertension Continues to be elevated.  We will increase amlodipine.  She has been referred to cardiology for evaluation of hypertension for possible secondary causes.  We will try to get her appointment moved up.  Subcutaneous nodule Nonspecific finding.  No other nodules noted.  No lymphadenopathy elsewhere.  We will get an ultrasound to evaluate further.    Orders Placed This Encounter  Procedures  . US Soft Tissue Head/Neck    Standing Status:   Future    Standing Expiration Date:   01/20/2019    Order Specific Question:   Reason for Exam (SYMPTOM  OR DIAGNOSIS REQUIRED)    Answer:   right posterolateral neck with subcutaneous nodule, present for 1 year    Order Specific Question:   Preferred imaging location?    Answer:   [facility]    Meds ordered this encounter  Medications  . amLODipine (NORVASC) 10 MG tablet    Sig: Take 1 tablet (10 mg total) by mouth daily.    Dispense:  90 tablet    Refill:  1     Marikay AlarEric Heloise Gordan, MD [facility] [place]

## 2017-11-19 NOTE — Assessment & Plan Note (Signed)
Nonspecific finding.  No other nodules noted.  No lymphadenopathy elsewhere.  We will get an ultrasound to evaluate further.

## 2017-11-19 NOTE — Assessment & Plan Note (Signed)
Continues to be elevated.  We will increase amlodipine.  She has been referred to cardiology for evaluation of hypertension for possible secondary causes.  We will try to get her appointment moved up.

## 2017-11-19 NOTE — Patient Instructions (Signed)
Nice to see you. We will increase your amlodipine.  This was sent to your pharmacy.  We will try to get your cardiology appointment moved up. We will obtain an ultrasound of your head and neck to evaluate the lesion.  If you do not hear about this in the next week please let us know.

## 2017-11-22 ENCOUNTER — Ambulatory Visit: Payer: BC Managed Care – PPO

## 2017-11-26 ENCOUNTER — Ambulatory Visit
Admission: RE | Admit: 2017-11-26 | Discharge: 2017-11-26 | Disposition: A | Discharge status: Home / Self Care | Payer: BC Managed Care – PPO | Source: Ambulatory Visit | Attending: Family Medicine | Admitting: Family Medicine

## 2017-11-26 DIAGNOSIS — R221 Localized swelling, mass and lump, neck: Secondary | ICD-10-CM | POA: Insufficient documentation

## 2017-11-26 DIAGNOSIS — R229 Localized swelling, mass and lump, unspecified: Secondary | ICD-10-CM

## 2017-11-28 ENCOUNTER — Other Ambulatory Visit: Payer: Self-pay | Admitting: Internal Medicine

## 2017-11-28 DIAGNOSIS — N289 Disorder of kidney and ureter, unspecified: Secondary | ICD-10-CM

## 2017-11-29 ENCOUNTER — Telehealth: Payer: Self-pay

## 2017-11-29 NOTE — Telephone Encounter (Signed)
Copied from CRM [id number]. Topic: General - Other >> Nov 29, 2017  3:14 PM Gean BirchwoodWilliams-Neal, Sade R wrote: Pt called in and stated she [place] [facility]. She states could she receive a call back by 5pm today 11/29/2017

## 2017-12-01 ENCOUNTER — Other Ambulatory Visit: Payer: Self-pay | Admitting: Family Medicine

## 2017-12-02 ENCOUNTER — Telehealth: Payer: Self-pay | Admitting: *Deleted

## 2017-12-02 DIAGNOSIS — R221 Localized swelling, mass and lump, neck: Secondary | ICD-10-CM

## 2017-12-02 NOTE — Telephone Encounter (Signed)
Copied from CRM [phone]. Topic: General - Other >> Nov 29, 2017  3:14 PM Gean BirchwoodWilliams-Neal, Sade R wrote: Pt called in and stated she [place] [facility]. She states could she receive a call back by 5pm today 11/29/2017 >> Nov 29, 2017  5:08 PM Tamela OddiHarris, Brenda J wrote: Patient called to get the status on the referral for [facility] tests at [facility].  Patient stated that she wanted the information by 5pm today.  Patient would like a call back as soon as it completed.

## 2017-12-02 NOTE — Telephone Encounter (Signed)
Thank you for getting this sorted for her.

## 2017-12-02 NOTE — Telephone Encounter (Signed)
I thought I had placed this over the weekend. I will place the order now.

## 2017-12-02 NOTE — Telephone Encounter (Signed)
Dr. Birdie SonsSonnenberg, I just saw this message since I left at 2:00 Friday. I do not see an order for a CT.

## 2017-12-02 NOTE — Telephone Encounter (Signed)
Authorization received. Sent referral to [facility]. Contacted pt and apologized for just now getting back with her since Dr. Birdie SonsSonnenberg was out of the office Friday and I had left prior to her sending the message.  Gave her the number to call and she will call [facility] to get this scheduled.

## 2017-12-05 ENCOUNTER — Ambulatory Visit: Payer: BC Managed Care – PPO

## 2017-12-10 ENCOUNTER — Ambulatory Visit
Admission: RE | Admit: 2017-12-10 | Discharge: 2017-12-10 | Disposition: A | Discharge status: Home / Self Care | Payer: BC Managed Care – PPO | Source: Ambulatory Visit | Attending: Family Medicine | Admitting: Family Medicine

## 2017-12-10 DIAGNOSIS — R221 Localized swelling, mass and lump, neck: Secondary | ICD-10-CM

## 2017-12-10 MED ORDER — IOPAMIDOL (ISOVUE-300) INJECTION 61%
75.0000 mL | Freq: Once | INTRAVENOUS | Status: AC | PRN
  Administered 2017-12-10: 75 mL via INTRAVENOUS

## 2017-12-11 ENCOUNTER — Telehealth: Payer: Self-pay | Admitting: Family Medicine

## 2017-12-11 DIAGNOSIS — R591 Generalized enlarged lymph nodes: Secondary | ICD-10-CM

## 2017-12-11 NOTE — Telephone Encounter (Signed)
I called and spoke with the patient regarding her CT scan results.  I discussed that her scan did reveal lymphadenopathy in her neck as well as her armpits and pectoral area.  Discussed that this is concerning for lymphoproliferative disease though potentially could be related to some other cause.  Advised that they recommended a biopsy.  We will refer her to oncology to get biopsy and other evaluation set up for this.  I will forward this message to [facility] to try to get her set up for an appointment within the next week or so given concern for lymphoproliferative disorder.  Referral has been placed.  She can be referred to oncology in [place] or in [place].  Whomever can get her in quickest is preferable.

## 2017-12-12 NOTE — Telephone Encounter (Signed)
Got it.

## 2017-12-12 NOTE — Telephone Encounter (Signed)
Thanks for confirming. Hopefully they can see her fairly quickly. Thanks.

## 2017-12-13 ENCOUNTER — Encounter: Payer: Self-pay | Admitting: Oncology

## 2017-12-13 ENCOUNTER — Inpatient Hospital Stay: Payer: BC Managed Care – PPO | Attending: Oncology | Admitting: Oncology

## 2017-12-13 ENCOUNTER — Other Ambulatory Visit: Payer: Self-pay

## 2017-12-13 ENCOUNTER — Inpatient Hospital Stay: Payer: BC Managed Care – PPO

## 2017-12-13 VITALS — BP 145/89 | HR 112 | Temp 98.0°F | Resp 18 | Ht 66.0 in | Wt 193.2 lb

## 2017-12-13 DIAGNOSIS — R591 Generalized enlarged lymph nodes: Secondary | ICD-10-CM | POA: Diagnosis present

## 2017-12-13 DIAGNOSIS — I10 Essential (primary) hypertension: Secondary | ICD-10-CM | POA: Insufficient documentation

## 2017-12-13 LAB — CBC WITH DIFFERENTIAL/PLATELET
BASOS ABS: 0 10*3/uL (ref 0–0.1)
BASOS PCT: 1 %
EOS ABS: 0 10*3/uL (ref 0–0.7)
Eosinophils Relative: 1 %
HEMATOCRIT: 40.6 % (ref 35.0–47.0)
Hemoglobin: 13.8 g/dL (ref 12.0–16.0)
Lymphocytes Relative: 25 %
Lymphs Abs: 1 10*3/uL (ref 1.0–3.6)
MCH: 30.2 pg (ref 26.0–34.0)
MCHC: 34.1 g/dL (ref 32.0–36.0)
MCV: 88.6 fL (ref 80.0–100.0)
MONO ABS: 0.4 10*3/uL (ref 0.2–0.9)
Monocytes Relative: 11 %
NEUTROS ABS: 2.5 10*3/uL (ref 1.4–6.5)
NEUTROS PCT: 62 %
Platelets: 168 10*3/uL (ref 150–440)
RBC: 4.59 MIL/uL (ref 3.80–5.20)
RDW: 13.2 % (ref 11.5–14.5)
WBC: 3.9 10*3/uL (ref 3.6–11.0)

## 2017-12-13 LAB — COMPREHENSIVE METABOLIC PANEL
ALK PHOS: 44 U/L (ref 38–126)
ALT: 18 U/L (ref 0–44)
AST: 22 U/L (ref 15–41)
Albumin: 4.7 g/dL (ref 3.5–5.0)
Anion gap: 8 (ref 5–15)
BILIRUBIN TOTAL: 0.9 mg/dL (ref 0.3–1.2)
BUN: 11 mg/dL (ref 6–20)
CALCIUM: 9.6 mg/dL (ref 8.9–10.3)
CO2: 25 mmol/L (ref 22–32)
CREATININE: 0.59 mg/dL (ref 0.44–1.00)
Chloride: 105 mmol/L (ref 98–111)
GFR calc non Af Amer: >60 mL/min (ref >60)
Glucose, Bld: 92 mg/dL (ref 70–99)
Potassium: 3.8 mmol/L (ref 3.5–5.1)
Sodium: 138 mmol/L (ref 135–145)
TOTAL PROTEIN: 8.7 g/dL — AB (ref 6.5–8.1)

## 2017-12-13 LAB — URIC ACID: Uric Acid, Serum: 5.3 mg/dL (ref 2.5–7.1)

## 2017-12-13 LAB — SEDIMENTATION RATE: Sed Rate: 18 mm/hr (ref 0–20)

## 2017-12-13 LAB — LACTATE DEHYDROGENASE: LDH: 124 U/L (ref 98–192)

## 2017-12-13 LAB — PATHOLOGIST SMEAR REVIEW

## 2017-12-13 NOTE — Progress Notes (Signed)
Hematology/Oncology Consult note [facility] Telephone:([phone] [phone]   Patient Care Team: Leone Haven, MD as PCP - General (Family Medicine)  REFERRING PROVIDER: Leone Haven, MD CHIEF COMPLAINTS/REASON FOR VISIT:  Evaluation of lymphadenopathy  HISTORY OF PRESENTING ILLNESS:  Terri Bennett is a  22 y.o.  female with PMH listed below who was referred to me for evaluation of lymphadenopathy.  Patient was recently seen by primary care physician reports that she has a nodule on her right posterior neck which has been present for about a year.  It does not bother her.  Nontender.  Patient feels the size of the nodule has not changed. Data reviewed 11/26/2017 ultrasound neck showed palpable abnormality appears to be correlated with a posterior triangle lymph nodes on the right measuring 1.4 x 0.5 x 0.9 cm.  There are also other posterior triangle lymph nodes on the right.  New 9/3 2019 CT soft tissue neck with contrast showed bilateral cervical and a sub-pectoral/axillary lymphadenopathy.  This is concerning for lymphoproliferative disease.  Patient was referred to cancer center for further evaluation. Today she was accompanied by her mother.  She denies any weight loss, fever or chills.  Feels well.  No fatigue. Mother has lymphoma.  She also has a significant family history of breast cancer, Maternal grandmother, 2 maternal cousins   Review of Systems  Constitutional: Negative for chills, fever, malaise/fatigue and weight loss.  HENT: Negative for nosebleeds and sore throat.        Neck lymph node  Eyes: Negative for double vision, photophobia and redness.  Respiratory: Negative for cough, shortness of breath and wheezing.   Cardiovascular: Negative for chest pain, palpitations and orthopnea.  Gastrointestinal: Negative for abdominal pain, blood in stool, nausea and vomiting.  Genitourinary: Negative for dysuria.  Musculoskeletal:  Negative for back pain, myalgias and neck pain.  Skin: Negative for itching and rash.  Neurological: Negative for dizziness, tingling and tremors.  Endo/Heme/Allergies: Negative for environmental allergies. Does not bruise/bleed easily.  Psychiatric/Behavioral: Negative for depression.    MEDICAL HISTORY:  Past Medical History:  Diagnosis Date  . Chickenpox   . Hypertension     SURGICAL HISTORY: History reviewed. No pertinent surgical history.  SOCIAL HISTORY: Social History   Socioeconomic History  . Marital status: Single    Spouse name: Not on file  . Number of children: Not on file  . Years of education: Not on file  . Highest education level: Not on file  Occupational History  . Not on file  Social Needs  . Financial resource strain: Not on file  . Food insecurity:    Worry: Not on file    Inability: Not on file  . Transportation needs:    Medical: Not on file    Non-medical: Not on file  Tobacco Use  . Smoking status: Never Smoker  . Smokeless tobacco: Never Used  Substance and Sexual Activity  . Alcohol use: Yes    Alcohol/week: 0.0 standard drinks    Comment: social   . Drug use: No  . Sexual activity: Not on file  Lifestyle  . Physical activity:    Days per week: Not on file    Minutes per session: Not on file  . Stress: Not on file  Relationships  . Social connections:    Talks on phone: Not on file    Gets together: Not on file    Attends religious service: Not on file    Active  member of club or organization: Not on file    Attends meetings of clubs or organizations: Not on file    Relationship status: Not on file  . Intimate partner violence:    Fear of current or ex partner: Not on file    Emotionally abused: Not on file    Physically abused: Not on file    Forced sexual activity: Not on file  Other Topics Concern  . Not on file  Social History Narrative  . Not on file    FAMILY HISTORY: Family History  Problem Relation Age of Onset   . Breast cancer Maternal Grandmother   . Diabetes Unknown   . Lymphoma Mother     ALLERGIES:  has No Known Allergies.  MEDICATIONS:  Current Outpatient Medications  Medication Sig Dispense Refill  . amLODipine (NORVASC) 10 MG tablet Take 1 tablet (10 mg total) by mouth daily. 90 tablet 1   No current facility-administered medications for this visit.      PHYSICAL EXAMINATION: ECOG PERFORMANCE STATUS: 0 - Asymptomatic Vitals:   12/13/17 1043  BP: (!) 145/89  Pulse: (!) 112  Resp: 18  Temp: 98 F (36.7 C)   Filed Weights   12/13/17 1043  Weight: 193 lb 3.2 oz (87.6 kg)    Physical Exam  Constitutional: She is oriented to person, place, and time. She appears well-developed and well-nourished. No distress.  HENT:  Head: Normocephalic and atraumatic.  Right Ear: External ear normal.  Left Ear: External ear normal.  Mouth/Throat: Oropharynx is clear and moist.  Eyes: Pupils are equal, round, and reactive to light. EOM are normal. No scleral icterus.  Neck: Normal range of motion. Neck supple.  Right posterior neck node, 1.5cm, soft.  Bilateral small cervical lymphadenopathy.   Cardiovascular: Normal rate, regular rhythm and normal heart sounds.  Pulmonary/Chest: Effort normal. No respiratory distress. She has no wheezes.  Abdominal: Soft. Bowel sounds are normal. She exhibits no distension and no mass. There is no tenderness.  Musculoskeletal: Normal range of motion. She exhibits no edema or deformity.  Neurological: She is alert and oriented to person, place, and time. No cranial nerve deficit. Coordination normal.  Skin: Skin is warm and dry. No rash noted. No erythema.  Psychiatric: She has a normal mood and affect. Her behavior is normal. Thought content normal.     LABORATORY DATA:  I have reviewed the data as listed Lab Results  Component Value Date   WBC 3.9 12/13/2017   HGB 13.8 12/13/2017   HCT 40.6 12/13/2017   MCV 88.6 12/13/2017   PLT 168 12/13/2017    Recent Labs    12/13/17 1209  NA 138  K 3.8  CL 105  CO2 25  GLUCOSE 92  BUN 11  CREATININE 0.59  CALCIUM 9.6  GFRNONAA >60  GFRAA >60  PROT 8.7*  ALBUMIN 4.7  AST 22  ALT 18  ALKPHOS 44  BILITOT 0.9   Iron/TIBC/Ferritin/ %Sat No results found for: IRON, TIBC, FERRITIN, IRONPCTSAT      ASSESSMENT & PLAN:  1. Lymphadenopathy, generalized    [place] and CT neck images were independently reviewed and discussed with patient.  Generalized lymphadenopathy can be due to autoimmune disease, lymphoproliferative disease, etc.  Will check CBC, CMP, smear LDH, uric acid, peripheral flowcytometry, hepatitis, HIV, ANA, Esr.   Recommend PET scan to evaluate metabolic activity to help planning best area for excisional biopsy. Patient prefers labs first and we will discuss labs next week and decide next  steps.   Patient and her mother are very anxious and have many questions. We spent sufficient time to discuss many aspect of care, questions were answered to patient's satisfaction.  Orders Placed This Encounter  Procedures  . CBC with Differential/Platelet    Standing Status:   Future    Number of Occurrences:   1    Standing Expiration Date:   12/14/2018  . Comprehensive metabolic panel    Standing Status:   Future    Number of Occurrences:   1    Standing Expiration Date:   12/14/2018  . Lactate dehydrogenase    Standing Status:   Future    Number of Occurrences:   1    Standing Expiration Date:   12/14/2018  . Uric acid    Standing Status:   Future    Number of Occurrences:   1    Standing Expiration Date:   12/14/2018  . Flow cytometry panel-leukemia/lymphoma work-up    Standing Status:   Future    Number of Occurrences:   1    Standing Expiration Date:   12/14/2018  . Pathologist smear review    Standing Status:   Future    Number of Occurrences:   1    Standing Expiration Date:   12/14/2018  . Epstein-Barr virus VCA antibody panel    Standing Status:   Future    Number of  Occurrences:   1    Standing Expiration Date:   12/14/2018  . ANA, IFA (with reflex)    Standing Status:   Future    Number of Occurrences:   1    Standing Expiration Date:   12/14/2018  . Sedimentation rate    Standing Status:   Future    Number of Occurrences:   1    Standing Expiration Date:   12/14/2018  . Hepatitis panel, acute    Standing Status:   Future    Number of Occurrences:   1    Standing Expiration Date:   12/14/2018  . HIV antibody    Standing Status:   Future    Number of Occurrences:   1    Standing Expiration Date:   12/14/2018    All questions were answered. The patient knows to call the clinic with any problems questions or concerns.  Return of visit: 1 week Thank you for this kind referral and the opportunity to participate in the care of this patient. A copy of today's note is routed to referring provider  Total face to face encounter time for this patient visit was 60 min. >50% of the time was  spent in counseling and coordination of care.    Earlie Server, MD, PhD Hematology Oncology [facility] at [facility] Pager- [phone] 12/13/2017

## 2017-12-13 NOTE — Progress Notes (Signed)
Patient here for initial visit. °

## 2017-12-13 NOTE — Telephone Encounter (Signed)
They are seeing her today (9/6) at 11:00.

## 2017-12-14 LAB — EPSTEIN-BARR VIRUS VCA ANTIBODY PANEL
EBV Early Antigen Ab, IgG: <9 U/mL (ref 0.0–8.9)
EBV NA IgG: 136 U/mL — ABNORMAL HIGH (ref 0.0–17.9)
EBV VCA IgG: 150 U/mL — ABNORMAL HIGH (ref 0.0–17.9)
EBV VCA IgM: <36 U/mL (ref 0.0–35.9)

## 2017-12-14 LAB — HEPATITIS PANEL, ACUTE
Hep A IgM: NEGATIVE
Hep B C IgM: NEGATIVE
Hepatitis B Surface Ag: NEGATIVE

## 2017-12-14 LAB — HIV ANTIBODY (ROUTINE TESTING W REFLEX): HIV SCREEN 4TH GENERATION: NONREACTIVE

## 2017-12-16 LAB — FANA STAINING PATTERNS

## 2017-12-16 LAB — ANTINUCLEAR ANTIBODIES, IFA: ANTINUCLEAR ANTIBODIES, IFA: POSITIVE — AB

## 2017-12-16 NOTE — Telephone Encounter (Signed)
Seen by Dr Cathie Hoops 12/13/17

## 2017-12-19 LAB — COMP PANEL: LEUKEMIA/LYMPHOMA

## 2017-12-20 ENCOUNTER — Encounter: Payer: Self-pay | Admitting: Oncology

## 2017-12-20 ENCOUNTER — Other Ambulatory Visit: Payer: Self-pay

## 2017-12-20 ENCOUNTER — Inpatient Hospital Stay (HOSPITAL_BASED_OUTPATIENT_CLINIC_OR_DEPARTMENT_OTHER): Payer: BC Managed Care – PPO | Admitting: Oncology

## 2017-12-20 VITALS — BP 150/101 | HR 84 | Temp 97.5°F | Wt 195.2 lb

## 2017-12-20 DIAGNOSIS — R591 Generalized enlarged lymph nodes: Secondary | ICD-10-CM

## 2017-12-20 DIAGNOSIS — I10 Essential (primary) hypertension: Secondary | ICD-10-CM

## 2017-12-20 LAB — PREGNANCY, URINE: PREG TEST UR: NEGATIVE

## 2017-12-20 NOTE — Progress Notes (Signed)
Pt here for follow up. No concerns voiced. Bp elevated, pt states being anxious all day.

## 2017-12-22 NOTE — Progress Notes (Addendum)
Hematology/Oncology follow up  note [facility] [phone] [phone]   Patient Care Team: Leone Haven, MD as PCP - General (Family Medicine)  REFERRING PROVIDER: Leone Haven, MD REASON FOR VISIT Follow up for treatment of  lymphadenopathy  HISTORY OF PRESENTING ILLNESS:  Terri Bennett is a  22 y.o.  female with PMH listed below who was referred to me for evaluation of lymphadenopathy.  Patient was recently seen by primary care physician reports that she has a nodule on her right posterior neck which has been present for about a year.  It does not bother her.  Nontender.  Patient feels the size of the nodule has not changed. Data reviewed 11/26/2017 ultrasound neck showed palpable abnormality appears to be correlated with a posterior triangle lymph nodes on the right measuring 1.4 x 0.5 x 0.9 cm.  There are also other posterior triangle lymph nodes on the right.  New 9/3 2019 CT soft tissue neck with contrast showed bilateral cervical and a sub-pectoral/axillary lymphadenopathy.  This is concerning for lymphoproliferative disease.  Patient was referred to cancer center for further evaluation. Today she was accompanied by her mother.  She denies any weight loss, fever or chills.  Feels well.  No fatigue. Mother has lymphoma.  She also has a significant family history of breast cancer, Maternal grandmother, 2 maternal cousins   INTERVAL HISTORY Terri Bennett is a 22 y.o. female who has above history reviewed by me today presents for follow up for management of lymphadenopathy.  reviewed by me today presents for follow up visit for management of lymphadenopathy.  She has had lab work up done during the interval and presents to discuss lab results and future management plan.    Review of Systems  Constitutional: Negative for chills, fever, malaise/fatigue and weight loss.  HENT: Negative for nosebleeds and sore throat.    Neck lymph node  Eyes: Negative for double vision, photophobia and redness.  Respiratory: Negative for cough, shortness of breath and wheezing.   Cardiovascular: Negative for chest pain, palpitations and orthopnea.  Gastrointestinal: Negative for abdominal pain, blood in stool, nausea and vomiting.  Genitourinary: Negative for dysuria.  Musculoskeletal: Negative for back pain, myalgias and neck pain.  Skin: Negative for itching and rash.  Neurological: Negative for dizziness, tingling and tremors.  Endo/Heme/Allergies: Negative for environmental allergies. Does not bruise/bleed easily.  Psychiatric/Behavioral: Negative for depression.    MEDICAL HISTORY:  Past Medical History:  Diagnosis Date  . Chickenpox   . Hypertension     SURGICAL HISTORY: History reviewed. No pertinent surgical history.  SOCIAL HISTORY: Social History   Socioeconomic History  . Marital status: Single    Spouse name: Not on file  . Number of children: Not on file  . Years of education: Not on file  . Highest education level: Not on file  Occupational History  . Not on file  Social Needs  . Financial resource strain: Not on file  . Food insecurity:    Worry: Not on file    Inability: Not on file  . Transportation needs:    Medical: Not on file    Non-medical: Not on file  Tobacco Use  . Smoking status: Never Smoker  . Smokeless tobacco: Never Used  Substance and Sexual Activity  . Alcohol use: Yes    Alcohol/week: 0.0 standard drinks    Comment: social   . Drug use: No  . Sexual activity: Not on file  Lifestyle  .  Physical activity:    Days per week: Not on file    Minutes per session: Not on file  . Stress: Not on file  Relationships  . Social connections:    Talks on phone: Not on file    Gets together: Not on file    Attends religious service: Not on file    Active member of club or organization: Not on file    Attends meetings of clubs or organizations: Not on file     Relationship status: Not on file  . Intimate partner violence:    Fear of current or ex partner: Not on file    Emotionally abused: Not on file    Physically abused: Not on file    Forced sexual activity: Not on file  Other Topics Concern  . Not on file  Social History Narrative  . Not on file    FAMILY HISTORY: Family History  Problem Relation Age of Onset  . Breast cancer Maternal Grandmother   . Diabetes Unknown   . Lymphoma Mother     ALLERGIES:  has No Known Allergies.  MEDICATIONS:  Current Outpatient Medications  Medication Sig Dispense Refill  . amLODipine (NORVASC) 10 MG tablet Take 1 tablet (10 mg total) by mouth daily. 90 tablet 1   No current facility-administered medications for this visit.      PHYSICAL EXAMINATION: ECOG PERFORMANCE STATUS: 0 - Asymptomatic Vitals:   12/20/17 1315  BP: (!) 150/101  Pulse: 84  Temp: (!) 97.5 F (36.4 C)   Filed Weights   12/20/17 1315  Weight: 195 lb 3.2 oz (88.5 kg)    Physical Exam  Constitutional: She is oriented to person, place, and time. She appears well-developed and well-nourished. No distress.  HENT:  Head: Normocephalic and atraumatic.  Right Ear: External ear normal.  Left Ear: External ear normal.  Mouth/Throat: Oropharynx is clear and moist.  Eyes: Pupils are equal, round, and reactive to light. EOM are normal. No scleral icterus.  Neck: Normal range of motion. Neck supple.  Right posterior neck node, 1.5cm, soft.  Bilateral small cervical lymphadenopathy.   Cardiovascular: Normal rate, regular rhythm and normal heart sounds.  Pulmonary/Chest: Effort normal. No respiratory distress. She has no wheezes.  Abdominal: Soft. Bowel sounds are normal. She exhibits no distension and no mass. There is no tenderness.  Musculoskeletal: Normal range of motion. She exhibits no edema or deformity.  Neurological: She is alert and oriented to person, place, and time. No cranial nerve deficit. Coordination normal.    Skin: Skin is warm and dry. No rash noted. No erythema.  Psychiatric: She has a normal mood and affect. Her behavior is normal. Thought content normal.     LABORATORY DATA:  I have reviewed the data as listed Lab Results  Component Value Date   WBC 3.9 12/13/2017   HGB 13.8 12/13/2017   HCT 40.6 12/13/2017   MCV 88.6 12/13/2017   PLT 168 12/13/2017   Recent Labs    12/13/17 1209  NA 138  K 3.8  CL 105  CO2 25  GLUCOSE 92  BUN 11  CREATININE 0.59  CALCIUM 9.6  GFRNONAA >60  GFRAA >60  PROT 8.7*  ALBUMIN 4.7  AST 22  ALT 18  ALKPHOS 44  BILITOT 0.9   Iron/TIBC/Ferritin/ %Sat No results found for: IRON, TIBC, FERRITIN, IRONPCTSAT   RADIOGRAPHIC STUDIES: I have personally reviewed the radiological images as listed and agreed with the findings in the report. 12/10/2017 CT soft tissue  Neck Bilateral cervical and subpectoral/axillary lymphadenopathy. This is concerning for lymphoproliferative disease, and biopsy is recommended.  ASSESSMENT & PLAN:  1. Lymphadenopathy, generalized   Lab results were reviewed and discussed with patient.  Negative HIV and hepatitis panel, normal ESR, negative EBV IgM. Normal uric acid level, normal LDH. Increased total protein level.  Peripheral blood flowcytometry showed unable to evaluate B cells clonality due to non specific light chain binding on B cells.  ANA is significantly elevated, 1:1280, speckled pattern. ? Autoimmune etiology?   Will proceed with PET scan and also refer to rheumatology. Check urine HCG level prior to the testing.  Plan also check serum protein electrophoresis.  We spent sufficient time to discuss many aspect of care, questions were answered to patient's satisfaction.  Orders Placed This Encounter  Procedures  . NM PET Image Initial (PI) Skull Base To Thigh    Standing Status:   Future    Standing Expiration Date:   02/20/2019    Order Specific Question:   ** REASON FOR EXAM (FREE TEXT)    Answer:    lymphadenopathy    Order Specific Question:   If indicated for the ordered procedure, I authorize the administration of a radiopharmaceutical per Radiology protocol    Answer:   Yes    Order Specific Question:   Is the patient pregnant?    Answer:   No    Order Specific Question:   Preferred imaging location?    Answer:   [facility]    Order Specific Question:   Radiology Contrast Protocol - do NOT remove file path    Answer:   \\[email or web address]  . Pregnancy, urine    Standing Status:   Future    Number of Occurrences:   1    Standing Expiration Date:   12/21/2018  . Ambulatory referral to Rheumatology    Referral Priority:   Routine    Referral Type:   Consultation    Referral Reason:   Specialty Services Required    Referred to Provider:   Marlowe Sax, MD    Requested Specialty:   Rheumatology    Number of Visits Requested:   1    All questions were answered. The patient knows to call the clinic with any problems questions or concerns.  Return of visit: after PET scan.  Total face to face encounter time for this patient visit was 15  min. >50% of the time was  spent in counseling and coordination of care.   Earlie Server, MD, PhD Hematology Oncology [facility] [place]- [phone] 12/22/2017   Addendum:PET scan was denied by patient's insurance due to lack of pathological diagnosis Will refer patient to ENT for evaluation of excisional biopsy of cervical lymph node.   Earlie Server

## 2017-12-26 ENCOUNTER — Telehealth: Payer: Self-pay | Admitting: *Deleted

## 2017-12-26 NOTE — Telephone Encounter (Signed)
Per Merry ProudBrandi. 12/26/17 staff message: Patients PET has been denied. And will R/S if insurance approves after a peer-to-peer.  I called patient and a message was left to make her aware that her scheduled PET for 12/27/17 @ 10:30 Has been cancelled. And to feel free to give the office a call.

## 2017-12-27 ENCOUNTER — Ambulatory Visit: Payer: BC Managed Care – PPO

## 2017-12-30 ENCOUNTER — Inpatient Hospital Stay: Payer: BC Managed Care – PPO | Admitting: Oncology

## 2017-12-30 ENCOUNTER — Inpatient Hospital Stay: Payer: BC Managed Care – PPO

## 2018-01-16 ENCOUNTER — Encounter

## 2018-01-16 ENCOUNTER — Ambulatory Visit: Payer: BC Managed Care – PPO | Admitting: Internal Medicine

## 2018-02-05 DIAGNOSIS — R591 Generalized enlarged lymph nodes: Secondary | ICD-10-CM

## 2018-02-05 HISTORY — DX: Generalized enlarged lymph nodes: R59.1

## 2018-02-11 ENCOUNTER — Other Ambulatory Visit: Payer: Self-pay | Admitting: Otolaryngology

## 2018-02-21 ENCOUNTER — Encounter (HOSPITAL_COMMUNITY): Payer: Self-pay

## 2018-02-21 ENCOUNTER — Encounter (HOSPITAL_COMMUNITY)
Admission: RE | Admit: 2018-02-21 | Discharge: 2018-02-21 | Disposition: A | Discharge status: Home / Self Care | Payer: BC Managed Care – PPO | Source: Ambulatory Visit | Attending: Otolaryngology | Admitting: Otolaryngology

## 2018-02-21 DIAGNOSIS — Z01818 Encounter for other preprocedural examination: Secondary | ICD-10-CM | POA: Diagnosis not present

## 2018-02-21 HISTORY — DX: Cardiac murmur, unspecified: R01.1

## 2018-02-21 LAB — CBC
HCT: 40.5 % (ref 36.0–46.0)
Hemoglobin: 13.2 g/dL (ref 12.0–15.0)
MCH: 29.3 pg (ref 26.0–34.0)
MCHC: 32.6 g/dL (ref 30.0–36.0)
MCV: 89.8 fL (ref 80.0–100.0)
NRBC: 0 % (ref 0.0–0.2)
Platelets: 213 10*3/uL (ref 150–400)
RBC: 4.51 MIL/uL (ref 3.87–5.11)
RDW: 12.7 % (ref 11.5–15.5)
WBC: 6 10*3/uL (ref 4.0–10.5)

## 2018-02-21 LAB — BASIC METABOLIC PANEL
ANION GAP: 8 (ref 5–15)
BUN: 7 mg/dL (ref 6–20)
CO2: 25 mmol/L (ref 22–32)
Calcium: 9.4 mg/dL (ref 8.9–10.3)
Chloride: 104 mmol/L (ref 98–111)
Creatinine, Ser: 0.66 mg/dL (ref 0.44–1.00)
GFR calc Af Amer: >60 mL/min (ref >60)
GFR calc non Af Amer: >60 mL/min (ref >60)
GLUCOSE: 89 mg/dL (ref 70–99)
POTASSIUM: 3.8 mmol/L (ref 3.5–5.1)
Sodium: 137 mmol/L (ref 135–145)

## 2018-02-21 NOTE — Pre-Procedure Instructions (Signed)
Terri Bennett  02/21/2018      [id number] [place], [place] - [address] [place] [address] Phone: [phone] Fax: [phone]  [id number] [place], [place] - [address] [place] [address] Phone: [phone] Fax: [phone]    Your procedure is scheduled on 02/28/18.  Report to [facility] Admitting at 8 A.M.  Call this number if you have problems the morning of surgery:  [phone]   Remember:  Do not eat or drink after midnight.     Take these medicines the morning of surgery with A SIP OF WATER --norvasc    Do not wear jewelry, make-up or nail polish.  Do not wear lotions, powders, or perfumes, or deodorant.  Do not shave 48 hours prior to surgery.  Men may shave face and neck.  Do not bring valuables to the hospital.  [facility] is not responsible for any belongings or valuables.  Contacts, dentures or bridgework may not be worn into surgery.  Leave your suitcase in the car.  After surgery it may be brought to your room.  For patients admitted to the hospital, discharge time will be determined by your treatment team.  Patients discharged the day of surgery will not be allowed to drive home.   Name and phone number of your driver:    Special instructions:  [facility] - Preparing for Surgery  Before surgery, you can play an important role.  Because skin is not sterile, your skin needs to be as free of germs as possible.  You can reduce the number of germs on you skin by washing with CHG (chlorahexidine gluconate) soap before surgery.  CHG is an antiseptic cleaner which kills germs and bonds with the skin to continue killing germs even after washing.  Oral Hygiene is also important in reducing the risk of infection.  Remember to brush your teeth with your regular toothpaste the morning of surgery.  Please DO NOT use if you have an allergy to CHG or antibacterial  soaps.  If your skin becomes reddened/irritated stop using the CHG and inform your nurse when you arrive at Short Stay.  Do not shave (including legs and underarms) for at least 48 hours prior to the first CHG shower.  You may shave your face.  Please follow these instructions carefully:   1.  Shower with CHG Soap the night before surgery and the morning of Surgery.  2.  If you choose to wash your hair, wash your hair first as usual with your normal shampoo.  3.  After you shampoo, rinse your hair and body thoroughly to remove the shampoo. 4.  Use CHG as you would any other liquid soap.  You can apply chg directly to the skin and wash gently with a      scrungie or washcloth.           5.  Apply the CHG Soap to your body ONLY FROM THE NECK DOWN.   Do not use on open wounds or open sores. Avoid contact with your eyes, ears, mouth and genitals (private parts).  Wash genitals (private parts) with your normal soap.  6.  Wash thoroughly, paying special attention to the area where your surgery will be performed.  7.  Thoroughly rinse your body with warm water from the neck down.  8.  DO NOT shower/wash with your normal soap after using and rinsing off the  CHG Soap.  9.  Pat yourself dry with a clean towel.            10.  Wear clean pajamas.            11.  Place clean sheets on your bed the night of your first shower and do not sleep with pets.  Day of Surgery  Do not apply any lotions/deoderants the morning of surgery.   Please wear clean clothes to the hospital/surgery center. Remember to brush your teeth with toothpaste.    Please read over the following fact sheets that you were given.

## 2018-02-28 ENCOUNTER — Ambulatory Visit (HOSPITAL_COMMUNITY)
Admission: RE | Admit: 2018-02-28 | Discharge: 2018-02-28 | Disposition: A | Discharge status: Home / Self Care | Payer: BC Managed Care – PPO | Source: Ambulatory Visit | Attending: Otolaryngology | Admitting: Otolaryngology

## 2018-02-28 ENCOUNTER — Encounter (HOSPITAL_COMMUNITY)
Admission: RE | Disposition: A | Discharge status: Home / Self Care | Payer: Self-pay | Source: Ambulatory Visit | Attending: Otolaryngology

## 2018-02-28 ENCOUNTER — Encounter (HOSPITAL_COMMUNITY): Payer: Self-pay

## 2018-02-28 ENCOUNTER — Ambulatory Visit (HOSPITAL_COMMUNITY): Payer: BC Managed Care – PPO | Admitting: Anesthesiology

## 2018-02-28 ENCOUNTER — Other Ambulatory Visit: Payer: Self-pay

## 2018-02-28 DIAGNOSIS — I10 Essential (primary) hypertension: Secondary | ICD-10-CM | POA: Insufficient documentation

## 2018-02-28 DIAGNOSIS — R590 Localized enlarged lymph nodes: Secondary | ICD-10-CM | POA: Diagnosis not present

## 2018-02-28 DIAGNOSIS — Z807 Family history of other malignant neoplasms of lymphoid, hematopoietic and related tissues: Secondary | ICD-10-CM | POA: Diagnosis not present

## 2018-02-28 DIAGNOSIS — Z79899 Other long term (current) drug therapy: Secondary | ICD-10-CM | POA: Insufficient documentation

## 2018-02-28 HISTORY — PX: LYMPH NODE BIOPSY: SHX201

## 2018-02-28 HISTORY — DX: Localized enlarged lymph nodes: R59.0

## 2018-02-28 SURGERY — LYMPH NODE BIOPSY
Anesthesia: General | Site: Neck | Laterality: Right

## 2018-02-28 MED ORDER — OXYCODONE HCL 5 MG/5ML PO SOLN: 5.0000 mg | Freq: Once | ORAL | Status: DC | PRN

## 2018-02-28 MED ORDER — ARTIFICIAL TEARS OPHTHALMIC OINT
TOPICAL_OINTMENT | OPHTHALMIC | Status: AC
  Filled 2018-02-28: qty 7

## 2018-02-28 MED ORDER — CEFAZOLIN SODIUM-DEXTROSE 2-4 GM/100ML-% IV SOLN
2.0000 g | INTRAVENOUS | Status: AC
  Administered 2018-02-28: 2 g via INTRAVENOUS
  Filled 2018-02-28: qty 100

## 2018-02-28 MED ORDER — LIDOCAINE 2% (20 MG/ML) 5 ML SYRINGE
INTRAMUSCULAR | Status: DC | PRN
  Administered 2018-02-28: 50 mg via INTRAVENOUS

## 2018-02-28 MED ORDER — LACTATED RINGERS IV SOLN
INTRAVENOUS | Status: DC | PRN
  Administered 2018-02-28: 09:00:00 via INTRAVENOUS

## 2018-02-28 MED ORDER — OXYCODONE HCL 5 MG PO TABS: 5.0000 mg | ORAL_TABLET | Freq: Once | ORAL | Status: DC | PRN

## 2018-02-28 MED ORDER — CHLORHEXIDINE GLUCONATE CLOTH 2 % EX PADS: 6.0000 | MEDICATED_PAD | Freq: Once | CUTANEOUS | Status: DC

## 2018-02-28 MED ORDER — HYDROCODONE-ACETAMINOPHEN 5-325 MG PO TABS: 1.0000 | ORAL_TABLET | Freq: Four times a day (QID) | ORAL | 0 refills | Status: AC | PRN

## 2018-02-28 MED ORDER — LIDOCAINE-EPINEPHRINE 1 %-1:100000 IJ SOLN
INTRAMUSCULAR | Status: DC | PRN
  Administered 2018-02-28: 1 mL

## 2018-02-28 MED ORDER — PHENYLEPHRINE HCL 10 MG/ML IJ SOLN
INTRAMUSCULAR | Status: DC | PRN
  Administered 2018-02-28 (×2): 40 ug via INTRAVENOUS

## 2018-02-28 MED ORDER — 0.9 % SODIUM CHLORIDE (POUR BTL) OPTIME
TOPICAL | Status: DC | PRN
  Administered 2018-02-28: 1000 mL

## 2018-02-28 MED ORDER — SCOPOLAMINE 1 MG/3DAYS TD PT72
MEDICATED_PATCH | TRANSDERMAL | Status: DC | PRN
  Administered 2018-02-28: 1 via TRANSDERMAL

## 2018-02-28 MED ORDER — PROPOFOL 10 MG/ML IV BOLUS
INTRAVENOUS | Status: DC | PRN
  Administered 2018-02-28: 200 mg via INTRAVENOUS

## 2018-02-28 MED ORDER — LIDOCAINE-EPINEPHRINE 1 %-1:100000 IJ SOLN
INTRAMUSCULAR | Status: AC
  Filled 2018-02-28: qty 1

## 2018-02-28 MED ORDER — ROCURONIUM BROMIDE 50 MG/5ML IV SOSY
PREFILLED_SYRINGE | INTRAVENOUS | Status: AC
  Filled 2018-02-28: qty 10

## 2018-02-28 MED ORDER — ONDANSETRON HCL 4 MG/2ML IJ SOLN
INTRAMUSCULAR | Status: AC
  Filled 2018-02-28: qty 4

## 2018-02-28 MED ORDER — FENTANYL CITRATE (PF) 100 MCG/2ML IJ SOLN: 25.0000 ug | INTRAMUSCULAR | Status: DC | PRN

## 2018-02-28 MED ORDER — SCOPOLAMINE 1 MG/3DAYS TD PT72
MEDICATED_PATCH | TRANSDERMAL | Status: AC
  Filled 2018-02-28: qty 1

## 2018-02-28 MED ORDER — LACTATED RINGERS IV SOLN: INTRAVENOUS | Status: DC

## 2018-02-28 MED ORDER — PROMETHAZINE HCL 25 MG/ML IJ SOLN: 6.2500 mg | INTRAMUSCULAR | Status: DC | PRN

## 2018-02-28 MED ORDER — LIDOCAINE 2% (20 MG/ML) 5 ML SYRINGE
INTRAMUSCULAR | Status: AC
  Filled 2018-02-28: qty 10

## 2018-02-28 MED ORDER — FENTANYL CITRATE (PF) 250 MCG/5ML IJ SOLN
INTRAMUSCULAR | Status: AC
  Filled 2018-02-28: qty 5

## 2018-02-28 MED ORDER — MIDAZOLAM HCL 2 MG/2ML IJ SOLN
INTRAMUSCULAR | Status: AC
  Filled 2018-02-28: qty 2

## 2018-02-28 MED ORDER — DEXAMETHASONE SODIUM PHOSPHATE 10 MG/ML IJ SOLN
10.0000 mg | Freq: Once | INTRAMUSCULAR | Status: AC
  Administered 2018-02-28: 10 mg via INTRAVENOUS
  Filled 2018-02-28: qty 1

## 2018-02-28 MED ORDER — MEPERIDINE HCL 50 MG/ML IJ SOLN: 6.2500 mg | INTRAMUSCULAR | Status: DC | PRN

## 2018-02-28 MED ORDER — FENTANYL CITRATE (PF) 100 MCG/2ML IJ SOLN
INTRAMUSCULAR | Status: DC | PRN
  Administered 2018-02-28: 100 ug via INTRAVENOUS

## 2018-02-28 MED ORDER — MIDAZOLAM HCL 5 MG/5ML IJ SOLN
INTRAMUSCULAR | Status: DC | PRN
  Administered 2018-02-28: 2 mg via INTRAVENOUS

## 2018-02-28 SURGICAL SUPPLY — 46 items
APPLIER CLIP 9.375 SM OPEN (CLIP)
BLADE SURG 15 STRL LF DISP TIS (BLADE) IMPLANT
BLADE SURG 15 STRL SS (BLADE)
CANISTER SUCT 3000ML PPV (MISCELLANEOUS) ×2 IMPLANT
CLEANER TIP ELECTROSURG 2X2 (MISCELLANEOUS) IMPLANT
CLIP APPLIE 9.375 SM OPEN (CLIP) IMPLANT
CONT SPEC 4OZ CLIKSEAL STRL BL (MISCELLANEOUS) ×2 IMPLANT
CORDS BIPOLAR (ELECTRODE) IMPLANT
COVER SURGICAL LIGHT HANDLE (MISCELLANEOUS) ×2 IMPLANT
COVER WAND RF STERILE (DRAPES) IMPLANT
DRAIN CHANNEL 15F RND FF W/TCR (WOUND CARE) IMPLANT
DRAPE HALF SHEET 40X57 (DRAPES) IMPLANT
DRAPE INCISE 23X17 IOBAN STRL (DRAPES)
DRAPE INCISE IOBAN 23X17 STRL (DRAPES) IMPLANT
ELECT COATED BLADE 2.86 ST (ELECTRODE) ×2 IMPLANT
ELECT REM PT RETURN 9FT ADLT (ELECTROSURGICAL) ×2
ELECTRODE REM PT RTRN 9FT ADLT (ELECTROSURGICAL) ×1 IMPLANT
EVACUATOR SILICONE 100CC (DRAIN) IMPLANT
GAUZE 4X4 16PLY RFD (DISPOSABLE) IMPLANT
GLOVE BIOGEL M 7.0 STRL (GLOVE) ×2 IMPLANT
GOWN STRL REUS W/ TWL LRG LVL3 (GOWN DISPOSABLE) ×2 IMPLANT
GOWN STRL REUS W/TWL LRG LVL3 (GOWN DISPOSABLE) ×2
KIT BASIN OR (CUSTOM PROCEDURE TRAY) ×2 IMPLANT
KIT TURNOVER KIT B (KITS) ×2 IMPLANT
LOCATOR NERVE 3 VOLT (DISPOSABLE) IMPLANT
NEEDLE HYPO 25GX1X1/2 BEV (NEEDLE) ×2 IMPLANT
NS IRRIG 1000ML POUR BTL (IV SOLUTION) ×2 IMPLANT
PAD ARMBOARD 7.5X6 YLW CONV (MISCELLANEOUS) ×4 IMPLANT
PENCIL BUTTON HOLSTER BLD 10FT (ELECTRODE) ×2 IMPLANT
SPECIMEN JAR MEDIUM (MISCELLANEOUS) IMPLANT
SPONGE INTESTINAL PEANUT (DISPOSABLE) IMPLANT
STAPLER VISISTAT 35W (STAPLE) ×2 IMPLANT
SUT CHROMIC 3 0 SH 27 (SUTURE) IMPLANT
SUT CHROMIC 5 0 P 3 (SUTURE) IMPLANT
SUT ETHILON 3 0 PS 1 (SUTURE) IMPLANT
SUT ETHILON 5 0 PS 2 18 (SUTURE) IMPLANT
SUT SILK 2 0 REEL (SUTURE) IMPLANT
SUT SILK 3 0 SH CR/8 (SUTURE) IMPLANT
SUT SILK 4 0 REEL (SUTURE) IMPLANT
SUT VIC AB 3-0 SH 18 (SUTURE) IMPLANT
SUT VIC AB 4-0 PC1 18 (SUTURE) ×2 IMPLANT
TOWEL OR 17X24 6PK STRL BLUE (TOWEL DISPOSABLE) ×2 IMPLANT
TRAY ENT MC OR (CUSTOM PROCEDURE TRAY) ×2 IMPLANT
TRAY FOLEY MTR SLVR 14FR STAT (SET/KITS/TRAYS/PACK) IMPLANT
TUBE FEEDING 10FR FLEXIFLO (MISCELLANEOUS) IMPLANT
WATER STERILE IRR 1000ML POUR (IV SOLUTION) IMPLANT

## 2018-02-28 NOTE — H&P (Signed)
Terri Bennett is an 22 y.o. female.   Chief Complaint: Cervical lymphadenopathy HPI: The patient was referred with a 1 year history of cervical lymphadenopathy.  Work-up including CT scan showed scattered lymph nodes in the neck bilaterally.  No recent symptoms of fever or infection.  Past Medical History:  Diagnosis Date  . Chickenpox   . Heart murmur    as child  . Hypertension     History reviewed. No pertinent surgical history.  Family History  Problem Relation Age of Onset  . Breast cancer Maternal Grandmother   . Diabetes Unknown   . Lymphoma Mother    Social History:  reports that she has never smoked. She has never used smokeless tobacco. She reports that she drinks alcohol. She reports that she does not use drugs.  Allergies: No Known Allergies  Medications Prior to Admission  Medication Sig Dispense Refill  . amLODipine (NORVASC) 10 MG tablet Take 1 tablet (10 mg total) by mouth daily. 90 tablet 1    No results found for this or any previous visit (from the past 48 hour(s)). No results found.  Review of Systems  Constitutional: Negative.   HENT: Negative.   Respiratory: Negative.   Cardiovascular: Negative.     Blood pressure (!) 140/95, pulse 77, temperature 98.2 F (36.8 C), temperature source Oral, resp. rate 20, height 5\' 6"  (1.676 m), weight 88.9 kg, last menstrual period 02/14/2018, SpO2 100 %. Physical Exam  Constitutional: She appears well-developed and well-nourished.  HENT:  Head: Normocephalic.  Right Ear: External ear normal.  Left Ear: External ear normal.  Nose: Nose normal.  Mouth/Throat: Oropharynx is clear and moist.  Respiratory: Effort normal.  GI: Soft.  Lymphadenopathy:    She has cervical adenopathy.     Assessment/Plan Patient admitted for excision of right posterior cervical lymph node under general anesthesia as an outpatient.  Osborn Cohoavid Ralph Brouwer, MD 02/28/2018, 9:09 AM

## 2018-02-28 NOTE — Op Note (Signed)
Operative Note: EXCISION NECK MASS  Patient: Terri Bennett  Medical record number: [id number]  Date:02/28/2018  Pre-operative Indications: Right Posterior Neck Mass  Postoperative Indications: Same  Surgical Procedure: Excision Right Posterior Neck Mass  Anesthesia: LMA  Surgeon: Barbee Coughavid L Sequoyah Counterman, M.D.  Assist: None  Complications: None  EBL: Min   Brief History: The patient is a 22 y.o. female with a history of enlarging cervical lymph adenopathy.  CT scan shows multiple enlarged lymph nodes.  Most prominent mass operable in the right posterior lateral neck. Given the patient's history and findings I recommended Excision of right posterior neck mass under general anesthesia.  Risks and benefits were discussed in detail with the patient and their family. They understand and agree with our plan for surgery which [facility] on an elective basis.  Surgical Procedure: The patient is brought to the operating room on 02/28/2018 and placed in supine position on the operating table. General LMA anesthesia was established without difficulty. When the patient was adequately anesthetized, surgical timeout was performed and correct identification of the patient and the surgical procedure. The patient was positioned and prepped and draped in sterile fashion. 1cc of 1% lidocaine/1:100,000 solution epinephrine was injected in a subcutaneous fashion overlying the palpable neck mass.  Using a #15 scalpel a 2 cm horizontally oriented skin incision was created in a pre-existing skin crease, this carried through the skin underlying deep subcutaneous tissue using Bovie electrocautery.  Soft tissue was elevated superiorly and inferiorly and dissection was carried deep to the platysma/superficial muscular layer.  This exposed the deep aspect of the neck and the palpable mass was dissected using Bovie electrocautery and blunt and sharp dissection.  The mass was removed and sent to  pathology for gross and microscopic evaluation.  Tissue also sent for lymphoma work-up.  The incision was thoroughly irrigated, hemostasis was obtained using Bovie electrocautery.  The incision was then closed in multiple layers beginning with reapproximation of the muscular layer using 4-0 Vicryl suture in interrupted fashion.  Subcutaneous layer was closed with interrupted 4-0 Vicryl suture.  Skin edge was approximated using Dermabond surgical glue.  An orogastric tube was passed and stomach contents were aspirated. Patient was awakened from anesthetic and transferred from the operating room to the recovery room in stable condition. There were no complications and blood loss was minimal.   Barbee Coughavid L Atthew Coutant, M.D. [facility] 02/28/2018

## 2018-02-28 NOTE — Anesthesia Preprocedure Evaluation (Addendum)
Anesthesia Evaluation  Patient identified by MRN, date of birth, ID band Patient awake    Reviewed: Allergy & Precautions, NPO status , Patient's Chart, lab work & pertinent test results  Airway Mallampati: I  TM Distance: >3 FB Neck ROM: Full    Dental  (+) Teeth Intact, Dental Advisory Given   Pulmonary neg pulmonary ROS,    breath sounds clear to auscultation       Cardiovascular hypertension, Pt. on medications  Rhythm:Regular Rate:Normal     Neuro/Psych negative neurological ROS     GI/Hepatic negative GI ROS, Neg liver ROS,   Endo/Other  negative endocrine ROS  Renal/GU negative Renal ROS     Musculoskeletal negative musculoskeletal ROS (+)   Abdominal Normal abdominal exam  (+)   Peds  Hematology negative hematology ROS (+)   Anesthesia Other Findings   Reproductive/Obstetrics                            Lab Results  Component Value Date   WBC 6.0 02/21/2018   HGB 13.2 02/21/2018   HCT 40.5 02/21/2018   MCV 89.8 02/21/2018   PLT 213 02/21/2018   Lab Results  Component Value Date   CREATININE 0.66 02/21/2018   BUN 7 02/21/2018   NA 137 02/21/2018   K 3.8 02/21/2018   CL 104 02/21/2018   CO2 25 02/21/2018   No results found for: INR, PROTIME   Anesthesia Physical Anesthesia Plan  ASA: II  Anesthesia Plan: General   Post-op Pain Management:    Induction: Intravenous  PONV Risk Score and Plan: 4 or greater and Ondansetron, Dexamethasone and Midazolam  Airway Management Planned: LMA  Additional Equipment: None  Intra-op Plan:   Post-operative Plan: Extubation in OR  Informed Consent: I have reviewed the patients History and Physical, chart, labs and discussed the procedure including the risks, benefits and alternatives for the proposed anesthesia with the patient or authorized representative who has indicated his/her understanding and acceptance.   Dental  advisory given  Plan Discussed with: CRNA  Anesthesia Plan Comments:        Anesthesia Quick Evaluation

## 2018-02-28 NOTE — Anesthesia Postprocedure Evaluation (Signed)
Anesthesia Post Note  Patient: Terri Bennett  Procedure(s) Performed: EXCISIONAL BIOPSY OF RIGHT POSTERIOR CERVICAL LYNPH NODE (Right Neck)     Patient location during evaluation: PACU Anesthesia Type: General Level of consciousness: sedated Pain management: pain level controlled Vital Signs Assessment: post-procedure vital signs reviewed and stable Respiratory status: spontaneous breathing and respiratory function stable Cardiovascular status: stable Postop Assessment: no apparent nausea or vomiting Anesthetic complications: no    Last Vitals:  Vitals:   02/28/18 1105 02/28/18 1120  BP: 129/89 129/88  Pulse: 66 62  Resp: 12 13  Temp:  (!) 36.4 C  SpO2: 97% 97%    Last Pain:  Vitals:   02/28/18 1120  TempSrc:   PainSc: 0-No pain                 [name]

## 2018-02-28 NOTE — Anesthesia Procedure Notes (Signed)
Procedure Name: LMA Insertion Date/Time: 02/28/2018 9:45 AM Performed by: Carmela RimaMartinelli, Omara Alcon F, CRNA Pre-anesthesia Checklist: Timeout performed, Patient being monitored, Suction available, Emergency Drugs available and Patient identified Patient Re-evaluated:Patient Re-evaluated prior to induction Oxygen Delivery Method: Circle system utilized Preoxygenation: Pre-oxygenation with 100% oxygen Induction Type: IV induction Ventilation: Mask ventilation without difficulty LMA: LMA inserted LMA Size: 4.0 Number of attempts: 1 Placement Confirmation: breath sounds checked- equal and bilateral and positive ETCO2 Tube secured with: Tape Dental Injury: Teeth and Oropharynx as per pre-operative assessment

## 2018-02-28 NOTE — Transfer of Care (Signed)
Immediate Anesthesia Transfer of Care Note  Patient: Terri Bennett  Procedure(s) Performed: EXCISIONAL BIOPSY OF RIGHT POSTERIOR CERVICAL LYNPH NODE (Right Neck)  Patient Location: PACU  Anesthesia Type:General  Level of Consciousness: awake, alert  and oriented  Airway & Oxygen Therapy: Patient Spontanous Breathing and Patient connected to nasal cannula oxygen  Post-op Assessment: Report given to RN, Post -op Vital signs reviewed and stable and Patient moving all extremities X 4  Post vital signs: Reviewed and stable  Last Vitals:  Vitals Value Taken Time  BP 126/89 02/28/2018 10:37 AM  Temp    Pulse 71 02/28/2018 10:38 AM  Resp 16 02/28/2018 10:38 AM  SpO2 100 % 02/28/2018 10:38 AM  Vitals shown include unvalidated device data.  Last Pain:  Vitals:   02/28/18 0832  TempSrc: Oral  PainSc: 0-No pain      Patients Stated Pain Goal: 2 (02/28/18 16100832)  Complications: No apparent anesthesia complications

## 2018-03-01 ENCOUNTER — Encounter (HOSPITAL_COMMUNITY): Payer: Self-pay | Admitting: Otolaryngology

## 2018-03-26 ENCOUNTER — Telehealth: Payer: Self-pay

## 2018-03-26 DIAGNOSIS — R768 Other specified abnormal immunological findings in serum: Secondary | ICD-10-CM

## 2018-03-26 NOTE — Telephone Encounter (Signed)
Noted.  I think it is reasonable for her to see the rheumatologist first given her elevated ANA and I have placed a referral.  I would certainly like for her to see the cardiologist to complete the work-up particularly if the rheumatologist feels that her blood pressure is not related to her elevated ANA.

## 2018-03-26 NOTE — Telephone Encounter (Signed)
Sent to PCP to place referral for patient

## 2018-03-26 NOTE — Telephone Encounter (Signed)
Copied from CRM [phone]. Topic: Referral - Request for Referral >> Mar 26, 2018 10:44 AM Trula SladeWalter, Linda F wrote: Has patient seen PCP for this complaint?  YES *If NO, is insurance requiring patient see PCP for this issue before PCP can refer them? Referral for which specialty:   Rheumatologist Preferred provider/office: Dr. Alben DeedsJames Beekman or Dr. Zenovia JordanAngela Hawkes Reason for referral:  Lip Nodes Biopsy

## 2018-03-26 NOTE — Telephone Encounter (Signed)
I have placed the referral. If she does not hear anything regarding this in the next 1-2 weeks she should let us know. It also looks like she was to complete a work up through cardiology for her blood pressure. Please find out if she able to complete this?

## 2018-03-26 NOTE — Telephone Encounter (Signed)
Called and spoke with patient. Pt advised. Pt stated that she did NOT follow up with the cardiologist yet due to the lymph node situation. Pt stated that she had lab work done and her ANA was elevated so she wanted to follow up with Rheumatologist first before she pays to see the cardiologist to see if this has anything to do with her elevated HR and BP. Pt stated that prior to her surgery she had normal HR and BP which taking her BP medication.

## 2018-03-27 NOTE — Telephone Encounter (Signed)
Called and spoke with patient. Pt advised and voiced understanding.  

## 2018-04-24 NOTE — Telephone Encounter (Addendum)
Can you please contact the patient to see if she was able to see the rheumatologist.

## 2018-04-24 NOTE — Telephone Encounter (Signed)
LMTCB

## 2018-04-25 NOTE — Telephone Encounter (Signed)
Once again left message asking if patient has seen rheumatologist.

## 2018-06-04 ENCOUNTER — Telehealth: Payer: Self-pay | Admitting: Family Medicine

## 2018-06-04 NOTE — Telephone Encounter (Signed)
LMTCB. Charleene Callegari, CMA 

## 2018-06-04 NOTE — Telephone Encounter (Signed)
Please contact the patient and see if she has been able to complete the work up through cardiology for her elevated blood pressure. If she has not completed this I think it would be worth while proceeding with this. Thanks.

## 2018-06-05 NOTE — Telephone Encounter (Signed)
Noted. If she is not going to follow-up with cardiology I would suggest that we at least complete an ultrasound of her kidneys to ensure that they are not contributing to her elevated BP. If she is willing to complete this I can place an order. Thanks.

## 2018-06-05 NOTE — Telephone Encounter (Signed)
I called pt and she states she has not seen the cardiologist because she states during her surgery there was no issue and she is seeing a rheumologist to see if her high blood pressure is coming from he joints or pain.  Coralee North, CMA

## 2018-06-06 NOTE — Telephone Encounter (Signed)
Noted.  When she is ready to complete evaluation she should let us know.

## 2018-06-06 NOTE — Telephone Encounter (Signed)
I poke with pt and she stated she could not afford but one specialist at a time so she did not want a ultrasound right now. and her rheumatologist is doing many tests on her, I suggested that all the tests she is having to sign a medical record release there and have them sent to you as well .  Coralee North, CMA

## 2018-12-18 ENCOUNTER — Other Ambulatory Visit: Payer: Self-pay

## 2018-12-19 ENCOUNTER — Encounter: Payer: Self-pay | Admitting: Family Medicine

## 2018-12-19 ENCOUNTER — Ambulatory Visit (INDEPENDENT_AMBULATORY_CARE_PROVIDER_SITE_OTHER): Payer: BC Managed Care – PPO | Admitting: Family Medicine

## 2018-12-19 ENCOUNTER — Other Ambulatory Visit: Payer: Self-pay

## 2018-12-19 VITALS — BP 130/90 | HR 90 | Temp 98.6°F | Ht 66.0 in | Wt 190.8 lb

## 2018-12-19 DIAGNOSIS — Z13220 Encounter for screening for lipoid disorders: Secondary | ICD-10-CM

## 2018-12-19 DIAGNOSIS — I158 Other secondary hypertension: Secondary | ICD-10-CM | POA: Diagnosis not present

## 2018-12-19 DIAGNOSIS — E669 Obesity, unspecified: Secondary | ICD-10-CM | POA: Diagnosis not present

## 2018-12-19 DIAGNOSIS — Z23 Encounter for immunization: Secondary | ICD-10-CM | POA: Diagnosis not present

## 2018-12-19 DIAGNOSIS — Z0001 Encounter for general adult medical examination with abnormal findings: Secondary | ICD-10-CM

## 2018-12-19 DIAGNOSIS — Z0000 Encounter for general adult medical examination without abnormal findings: Secondary | ICD-10-CM

## 2018-12-19 DIAGNOSIS — M329 Systemic lupus erythematosus, unspecified: Secondary | ICD-10-CM

## 2018-12-19 DIAGNOSIS — F419 Anxiety disorder, unspecified: Secondary | ICD-10-CM | POA: Insufficient documentation

## 2018-12-19 LAB — LIPID PANEL
Cholesterol: 139 mg/dL (ref 0–200)
HDL: 49.3 mg/dL (ref >39.00)
LDL Cholesterol: 76 mg/dL (ref 0–99)
NonHDL: 89.98
Total CHOL/HDL Ratio: 3
Triglycerides: 68 mg/dL (ref 0.0–149.0)
VLDL: 13.6 mg/dL (ref 0.0–40.0)

## 2018-12-19 LAB — COMPREHENSIVE METABOLIC PANEL
ALT: 14 U/L (ref 0–35)
AST: 45 U/L — ABNORMAL HIGH (ref 0–37)
Albumin: 3.7 g/dL (ref 3.5–5.2)
Alkaline Phosphatase: 29 U/L — ABNORMAL LOW (ref 39–117)
BUN: 11 mg/dL (ref 6–23)
CO2: 29 mEq/L (ref 19–32)
Calcium: 9.2 mg/dL (ref 8.4–10.5)
Chloride: 102 mEq/L (ref 96–112)
Creatinine, Ser: 0.62 mg/dL (ref 0.40–1.20)
GFR: 141.73 mL/min (ref >60.00)
Glucose, Bld: 82 mg/dL (ref 70–99)
Potassium: 4.4 mEq/L (ref 3.5–5.1)
Sodium: 138 mEq/L (ref 135–145)
Total Bilirubin: 0.4 mg/dL (ref 0.2–1.2)
Total Protein: 6.9 g/dL (ref 6.0–8.3)

## 2018-12-19 LAB — HEMOGLOBIN A1C: Hgb A1c MFr Bld: 5.4 % (ref 4.6–6.5)

## 2018-12-19 NOTE — Addendum Note (Signed)
Addended by: Fulton Mole D on: 12/19/2018 09:56 AM   Modules accepted: Orders

## 2018-12-19 NOTE — Assessment & Plan Note (Signed)
Recent blood pressures have been in the normal range off of medication.  Prior elevation likely related to her lupus and possibly a combination with her prior use of oral contraceptives.  She will monitor at home.  She will continue to follow with her rheumatologist.

## 2018-12-19 NOTE — Patient Instructions (Signed)
Nice to see you. Please continue to work on diet and exercise. Please check your blood pressure daily at home for the next week and send [place] her readings.  If it is elevated you will need to stop the Sprintec. We will contact you with your results of your labs.

## 2018-12-19 NOTE — Assessment & Plan Note (Signed)
Adequately controlled with therapy.  She will continue this.  She will let us know she would like to consider medication in the future.

## 2018-12-19 NOTE — Assessment & Plan Note (Signed)
Doing quite well on current treatment.  She will continue with her rheumatologist.

## 2018-12-19 NOTE — Progress Notes (Signed)
Tommi Rumps, MD Phone: [phone]  Terri Bennett is a 23 y.o. female who presents today for CPE.  Exercise: Walking 3-4 times weekly. Diet: Lots of chicken and vegetables.  Some unhealthy foods though not too much.  No soda or sweet tea. Pap smear: Patient reports possibly having had 1 of these last year through gynecology.  She sees gynecology later this month. She notes a family history of breast cancer in her maternal grandmother in her 31s.  Family history of ovarian cancer in a maternal cousin.  Patient reports she has discussed this history with her GYN.  No family history of colon cancer. She defers flu vaccine.  She is unsure when her last tetanus vaccine was. HIV screening up-to-date. No tobacco use or illicit drug use.  Occasional alcohol use with 1 to 2 glasses at a time. Sees a dentist and an ophthalmologist. She reports her blood pressures have been in the normal range on numerous checks at multiple doctors offices.  She is no longer on blood pressure medication.  She was diagnosed with lupus and her rheumatologist felt as though that was likely contributing to her elevated blood pressure.  She is back on Sprintec currently through her gynecologist and they are going to closely monitor her blood pressure and discontinue elevates. She is not currently sexually active. Has a menstrual cycle once monthly lasting 4 to 5 days. She does report some anxiety related to her lupus as well as COVID-19 pandemic.  She is been doing therapy and this has been helpful. Diagnosed with lupus in the last year or so.  She is doing quite well on Plaquenil.  Prior joint aches have improved significantly.  Active Ambulatory Problems    Diagnosis Date Noted  . HTN (hypertension) 10/20/2015  . Obesity 10/20/2015  . Encounter for general adult medical examination with abnormal findings 10/24/2015  . Anxiety 12/19/2018  . Systemic lupus erythematosus ([place]) 12/19/2018   Resolved Ambulatory  Problems    Diagnosis Date Noted  . Irregular menstrual bleeding 10/15/2016  . Lightheadedness 10/15/2016  . Contraceptive management 05/06/2017  . Subcutaneous nodule 11/19/2017  . Posterior cervical lymphadenopathy 02/28/2018  . Lymphadenopathy of head and neck 02/05/2018   Past Medical History:  Diagnosis Date  . Chickenpox   . Heart murmur   . Hypertension     Family History  Problem Relation Age of Onset  . Breast cancer Maternal Grandmother   . Diabetes Unknown   . Lymphoma Mother     Social History   Socioeconomic History  . Marital status: Single    Spouse name: Not on file  . Number of children: Not on file  . Years of education: Not on file  . Highest education level: Not on file  Occupational History  . Not on file  Social Needs  . Financial resource strain: Not on file  . Food insecurity    Worry: Not on file    Inability: Not on file  . Transportation needs    Medical: Not on file    Non-medical: Not on file  Tobacco Use  . Smoking status: Never Smoker  . Smokeless tobacco: Never Used  Substance and Sexual Activity  . Alcohol use: Yes    Alcohol/week: 0.0 standard drinks    Comment: social   . Drug use: No  . Sexual activity: Not on file  Lifestyle  . Physical activity    Days per week: Not on file    Minutes per session: Not on file  .  Stress: Not on file  Relationships  . Social Herbalist on phone: Not on file    Gets together: Not on file    Attends religious service: Not on file    Active member of club or organization: Not on file    Attends meetings of clubs or organizations: Not on file    Relationship status: Not on file  . Intimate partner violence    Fear of current or ex partner: Not on file    Emotionally abused: Not on file    Physically abused: Not on file    Forced sexual activity: Not on file  Other Topics Concern  . Not on file  Social History Narrative  . Not on file    ROS  General:  Negative for  nexplained weight loss, fever Skin: Negative for new or changing mole, sore that won't heal HEENT: Negative for trouble hearing, trouble seeing, ringing in ears, mouth sores, hoarseness, change in voice, dysphagia. CV:  Negative for chest pain, dyspnea, edema, palpitations Resp: Negative for cough, dyspnea, hemoptysis GI: Negative for nausea, vomiting, diarrhea, constipation, abdominal pain, melena, hematochezia. GU: Negative for dysuria, incontinence, urinary hesitance, hematuria, vaginal or penile discharge, polyuria, sexual difficulty, lumps in testicle or breasts MSK: Negative for muscle cramps or aches, joint pain or swelling Neuro: Negative for headaches, weakness, numbness, dizziness, passing out/fainting Psych: Positive for anxiety, negative for depression, memory problems  Objective  Physical Exam Vitals:   12/19/18 0902 12/19/18 0938  BP: (!) 140/100 130/90  Pulse: 90   Temp: 98.6 F (37 C)   SpO2: 99%     BP Readings from Last 3 Encounters:  12/19/18 130/90  02/28/18 129/88  02/21/18 138/86   Wt Readings from Last 3 Encounters:  12/19/18 190 lb 12.8 oz (86.5 kg)  02/28/18 196 lb (88.9 kg)  02/21/18 196 lb (88.9 kg)    Physical Exam Constitutional:      General: She is not in acute distress.    Appearance: She is not diaphoretic.  HENT:     Head: Normocephalic and atraumatic.  Eyes:     Conjunctiva/sclera: Conjunctivae normal.     Pupils: Pupils are equal, round, and reactive to light.  Cardiovascular:     Rate and Rhythm: Normal rate and regular rhythm.     Heart sounds: Normal heart sounds.  Pulmonary:     Effort: Pulmonary effort is normal.     Breath sounds: Normal breath sounds.  Abdominal:     General: Bowel sounds are normal. There is no distension.     Palpations: Abdomen is soft.     Tenderness: There is no abdominal tenderness. There is no guarding or rebound.  Musculoskeletal:     Right lower leg: No edema.  Skin:    General: Skin is warm  and dry.  Neurological:     Mental Status: She is alert.  Psychiatric:     Comments: Mood anxious, affect normal      Assessment/Plan:   Encounter for general adult medical examination with abnormal findings Physical exam completed.  Encouraged continued diet and exercise.  Blood pressure mildly elevated today on recheck.  This could be related to the [place].  This could also be white coat hypertension.  We will have her check her blood pressure daily for the next week and contact us with her readings.  If it is running elevated she will need to discontinue the Sprintec and follow-up with GYN.  She will keep her  follow-up appointment for later this month with GYN.  Pap smear to be completed through GYN.  We will request records from them.  Discussed condom use if she is to become sexually active.  Discussed flu vaccine though she defers today.  Tetanus vaccine given.  Lab work as outlined below.  HTN (hypertension) Recent blood pressures have been in the normal range off of medication.  Prior elevation likely related to her lupus and possibly a combination with her prior use of oral contraceptives.  She will monitor at home.  She will continue to follow with her rheumatologist.  Anxiety Adequately controlled with therapy.  She will continue this.  She will let us know she would like to consider medication in the future.  Systemic lupus erythematosus ([place]) Doing quite well on current treatment.  She will continue with her rheumatologist.   Orders Placed This Encounter  Procedures  . Lipid panel  . Comp Met (CMET)  . HgB A1c    No orders of the defined types were placed in this encounter.    Tommi Rumps, MD [place]

## 2018-12-19 NOTE — Assessment & Plan Note (Signed)
Physical exam completed.  Encouraged continued diet and exercise.  Blood pressure mildly elevated today on recheck.  This could be related to the [place].  This could also be white coat hypertension.  We will have her check her blood pressure daily for the next week and contact us with her readings.  If it is running elevated she will need to discontinue the Sprintec and follow-up with GYN.  She will keep her follow-up appointment for later this month with GYN.  Pap smear to be completed through GYN.  We will request records from them.  Discussed condom use if she is to become sexually active.  Discussed flu vaccine though she defers today.  Tetanus vaccine given.  Lab work as outlined below.

## 2018-12-20 ENCOUNTER — Other Ambulatory Visit: Payer: Self-pay | Admitting: Family Medicine

## 2018-12-20 DIAGNOSIS — R945 Abnormal results of liver function studies: Secondary | ICD-10-CM

## 2018-12-20 DIAGNOSIS — R7989 Other specified abnormal findings of blood chemistry: Secondary | ICD-10-CM

## 2019-01-29 IMAGING — CT CT NECK W/ CM
2 of 3 series · 7 of 14 positions shown, 8 images · IV contrast (iopamidol)
Comparison: Neck ultrasound 11/26/2017

CLINICAL DATA: Multiple neck masses.

EXAM:
CT NECK WITH CONTRAST
TECHNIQUE: Multidetector CT imaging of the neck was performed using the
standard protocol following the bolus administration of intravenous
contrast.
CONTRAST:  75mL [id number]-GHH IOPAMIDOL ([id number]-GHH) INJECTION 61%

[Series 3: neck · axial · 0.44mm/px · z∈[-213,-95]mm · 3 of 119 slices shown, 4 images]
[im 30/119  soft-tissue]
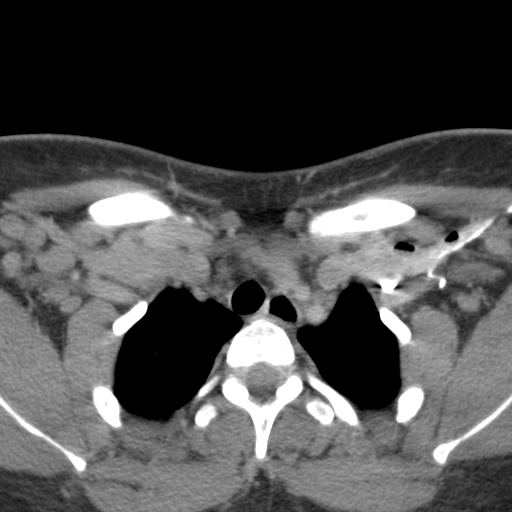
[im 30/119  bone]
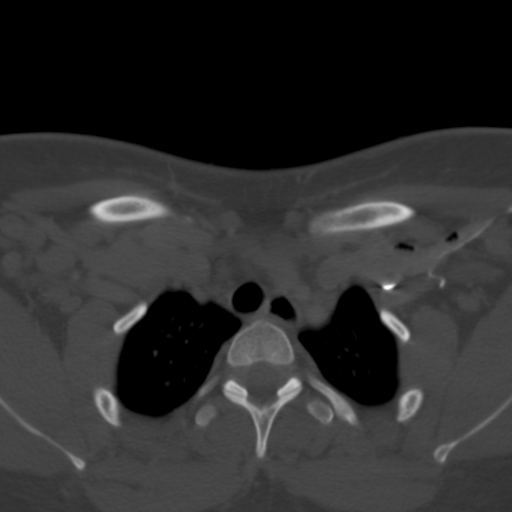
[im 60/119  bone]
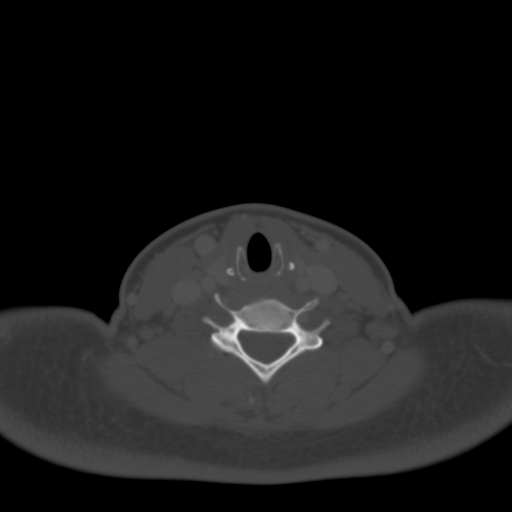
[im 89/119  bone]
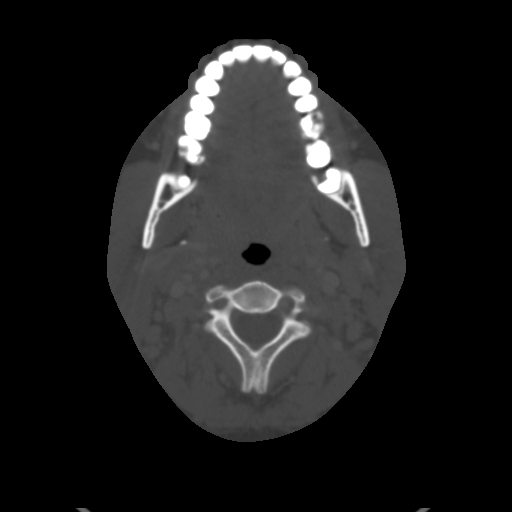

[Series 6: angled axial-oropharynx · axial · 0.42mm/px · z∈[-224,-82]mm · 4 of 119 slices shown]
[im 24/119  bone]
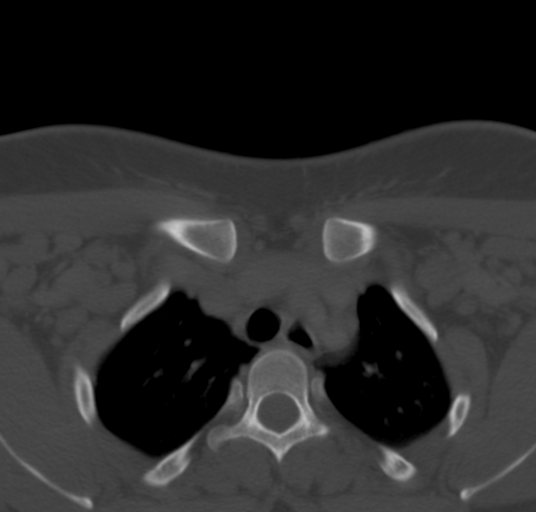
[im 48/119  bone]
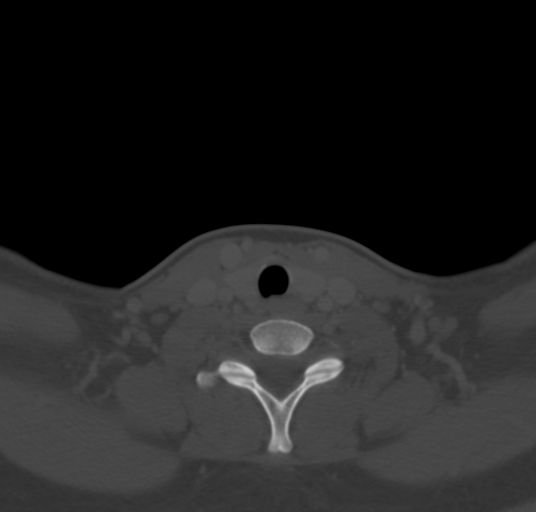
[im 71/119  bone]
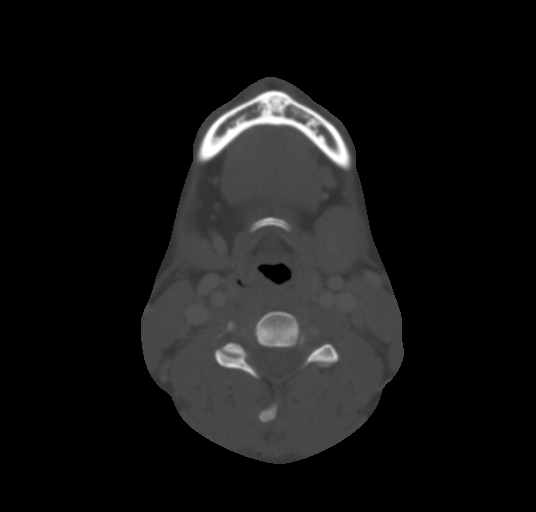
[im 95/119  bone]
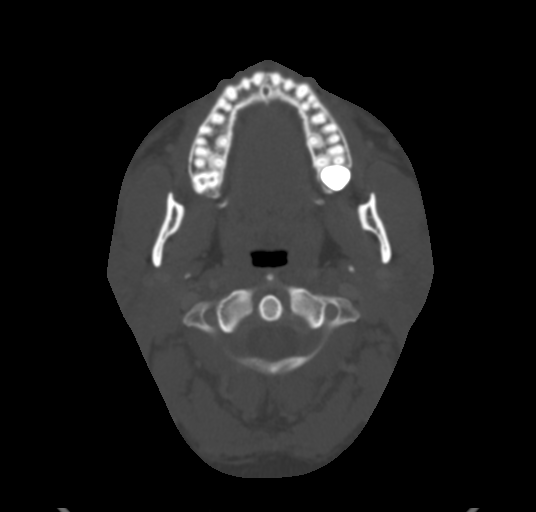

[7 of 14 positions shown; findings below may reference images not displayed]

FINDINGS: Pharynx and larynx: No evidence of pharyngeal mass or swelling. No
retropharyngeal fluid. Unremarkable larynx.

Salivary glands: No inflammation, mass, or stone.

Thyroid: Unremarkable.

Lymph nodes: There is an abnormally increased number of borderline
to mildly enlarged lymph nodes in the anterior and posterior
cervical chains bilaterally. The palpable abnormality corresponds to
an 11 mm short axis right level V lymph node. Additional
subcentimeter short axis level V lymph nodes are present
bilaterally. Level IIa lymph nodes measure up to 10 mm in short axis
bilaterally. Mildly prominent submental and submandibular lymph
nodes measure 8 mm. There is a 12 mm lymph node in the left upper
neck superficial to the sternocleidomastoid muscle just inferior to
the parotid tail. Supraclavicular lymph nodes measure up to 9 mm on
the right. More extensive lymphadenopathy is partially visualized in
the subpectoral and axillary regions bilaterally with individual
lymph nodes measuring up to 13 mm in short axis.

Vascular: Major vascular structures of the neck are patent.

Limited intracranial: Unremarkable.

Visualized orbits: Unremarkable.

Mastoids and visualized paranasal sinuses: Clear.

Skeleton: No acute osseous abnormality or suspicious osseous lesion.

Upper chest: No enlarged lymph nodes in the superior mediastinum.
Clear lung apices.

Other: None.
IMPRESSION: Bilateral cervical and subpectoral/axillary lymphadenopathy. This is
concerning for lymphoproliferative disease, and biopsy is
recommended.

## 2019-04-15 ENCOUNTER — Ambulatory Visit: Payer: BC Managed Care – PPO | Attending: Internal Medicine

## 2019-04-15 DIAGNOSIS — Z20822 Contact with and (suspected) exposure to covid-19: Secondary | ICD-10-CM

## 2019-04-16 LAB — NOVEL CORONAVIRUS, NAA: SARS-CoV-2, NAA: NOT DETECTED

## 2019-08-07 ENCOUNTER — Ambulatory Visit: Payer: BC Managed Care – PPO | Admitting: Family Medicine

## 2019-08-07 ENCOUNTER — Encounter: Payer: Self-pay | Admitting: Family Medicine

## 2019-08-07 ENCOUNTER — Other Ambulatory Visit: Payer: Self-pay

## 2019-08-07 VITALS — BP 130/80 | HR 90 | Temp 98.0°F | Ht 66.0 in | Wt 198.2 lb

## 2019-08-07 DIAGNOSIS — R809 Proteinuria, unspecified: Secondary | ICD-10-CM

## 2019-08-07 DIAGNOSIS — E6609 Other obesity due to excess calories: Secondary | ICD-10-CM | POA: Diagnosis not present

## 2019-08-07 DIAGNOSIS — Z6831 Body mass index (BMI) 31.0-31.9, adult: Secondary | ICD-10-CM

## 2019-08-07 DIAGNOSIS — I158 Other secondary hypertension: Secondary | ICD-10-CM | POA: Diagnosis not present

## 2019-08-07 DIAGNOSIS — M329 Systemic lupus erythematosus, unspecified: Secondary | ICD-10-CM | POA: Diagnosis not present

## 2019-08-07 NOTE — Patient Instructions (Signed)
Nice to see you. We will contact you with your urine test result. Please monitor your blood pressure daily for the next 2 weeks.

## 2019-08-07 NOTE — Assessment & Plan Note (Addendum)
Overall doing well.  She will continue to see rheumatology.  We will request last labs and records.

## 2019-08-07 NOTE — Assessment & Plan Note (Signed)
Blood pressure has trended up recently.  We will check urine protein creatinine ratio.  She will monitor blood pressures at home and return in 2 weeks for recheck.  If it proves to be elevated at home we will start medicine and obtain a renal Doppler.

## 2019-08-07 NOTE — Progress Notes (Signed)
  Marikay Alar, MD Phone: [phone]  Terri Bennett is a 24 y.o. female who presents today for f/u.  Hypertension: Elevated recently at gynecology that was okay rheumatology.  They plan to do urine protein testing.  No chest pain or shortness of breath.  She is no longer on OCPs to see if that would help.  Obesity: Patient notes relatively healthy diet with fruits and vegetables.  Occasional sandwich and lean cuisine for lunch.  Not as much exercise.  Lupus: Continues to follow with rheumatology.  She is on Plaquenil.  She has yearly eye exams.  Joint pains.  Social History   Tobacco Use  Smoking Status Never Smoker  Smokeless Tobacco Never Used     ROS see history of present illness  Objective  Physical Exam Vitals:   08/07/19 1352 08/07/19 1414  BP: (!) 160/80 130/80  Pulse: 90   Temp: 98 F (36.7 C)   SpO2: 98%     BP Readings from Last 3 Encounters:  08/07/19 130/80  12/19/18 130/90  02/28/18 129/88   Wt Readings from Last 3 Encounters:  08/07/19 198 lb 3.2 oz (89.9 kg)  12/19/18 190 lb 12.8 oz (86.5 kg)  02/28/18 196 lb (88.9 kg)    Physical Exam Constitutional:      General: She is not in acute distress.    Appearance: She is not diaphoretic.  Cardiovascular:     Rate and Rhythm: Normal rate and regular rhythm.     Heart sounds: Normal heart sounds.  Pulmonary:     Effort: Pulmonary effort is normal.     Breath sounds: Normal breath sounds.  Musculoskeletal:     Right lower leg: No edema.     Left lower leg: No edema.  Skin:    General: Skin is warm and dry.  Neurological:     Mental Status: She is alert.      Assessment/Plan: Please see individual problem list.  HTN (hypertension) Blood pressure has trended up recently.  We will check urine protein creatinine ratio.  She will monitor blood pressures at home and return in 2 weeks for recheck.  If it proves to be elevated at home we will start medicine and obtain a renal  Doppler.  Systemic lupus erythematosus (HCC) Overall doing well.  She will continue to see rheumatology.  We will request last labs and records.  Obesity Encouraged continued healthy diet and exercise.    Orders Placed This Encounter  Procedures  . Protein / creatinine ratio, urine    No orders of the defined types were placed in this encounter.   This visit occurred during the SARS-CoV-2 public health emergency.  Safety protocols were in place, including screening questions prior to the visit, additional usage of staff PPE, and extensive cleaning of exam room while observing appropriate contact time as indicated for disinfecting solutions.    Marikay Alar, MD [facility]

## 2019-08-07 NOTE — Assessment & Plan Note (Signed)
Encouraged continued healthy diet and exercise. 

## 2019-08-08 LAB — PROTEIN / CREATININE RATIO, URINE
Creatinine, Urine: 119 mg/dL (ref 20–275)
Protein/Creat Ratio: 840 mg/g creat — ABNORMAL HIGH (ref 21–161)
Protein/Creatinine Ratio: 0.84 mg/mg creat — ABNORMAL HIGH (ref 0.021–0.16)
Total Protein, Urine: 100 mg/dL — ABNORMAL HIGH (ref 5–24)

## 2019-08-11 ENCOUNTER — Other Ambulatory Visit: Payer: Self-pay | Admitting: Family Medicine

## 2019-08-11 DIAGNOSIS — R809 Proteinuria, unspecified: Secondary | ICD-10-CM

## 2019-08-27 ENCOUNTER — Other Ambulatory Visit (HOSPITAL_COMMUNITY): Payer: Self-pay | Admitting: Nephrology

## 2019-08-27 DIAGNOSIS — R809 Proteinuria, unspecified: Secondary | ICD-10-CM

## 2019-08-27 DIAGNOSIS — M329 Systemic lupus erythematosus, unspecified: Secondary | ICD-10-CM

## 2019-08-27 DIAGNOSIS — IMO0002 Reserved for concepts with insufficient information to code with codable children: Secondary | ICD-10-CM

## 2019-08-28 ENCOUNTER — Ambulatory Visit: Payer: BC Managed Care – PPO | Admitting: Family Medicine

## 2019-09-09 ENCOUNTER — Encounter (HOSPITAL_COMMUNITY): Payer: Self-pay

## 2019-09-09 ENCOUNTER — Ambulatory Visit (HOSPITAL_COMMUNITY)
Admission: RE | Admit: 2019-09-09 | Discharge: 2019-09-09 | Disposition: A | Discharge status: Home / Self Care | Payer: BC Managed Care – PPO | Source: Ambulatory Visit | Attending: Nephrology | Admitting: Nephrology

## 2019-09-09 ENCOUNTER — Other Ambulatory Visit: Payer: Self-pay

## 2019-09-09 DIAGNOSIS — M329 Systemic lupus erythematosus, unspecified: Secondary | ICD-10-CM | POA: Diagnosis not present

## 2019-09-09 DIAGNOSIS — R809 Proteinuria, unspecified: Secondary | ICD-10-CM | POA: Insufficient documentation

## 2019-09-09 LAB — CBC
HCT: 40.6 % (ref 36.0–46.0)
Hemoglobin: 14.1 g/dL (ref 12.0–15.0)
MCH: 31.1 pg (ref 26.0–34.0)
MCHC: 34.7 g/dL (ref 30.0–36.0)
MCV: 89.4 fL (ref 80.0–100.0)
Platelets: 239 10*3/uL (ref 150–400)
RBC: 4.54 MIL/uL (ref 3.87–5.11)
RDW: 12 % (ref 11.5–15.5)
WBC: 5.2 10*3/uL (ref 4.0–10.5)
nRBC: 0 % (ref 0.0–0.2)

## 2019-09-09 LAB — PREGNANCY, URINE: Preg Test, Ur: NEGATIVE

## 2019-09-09 LAB — PROTIME-INR
INR: 0.9 (ref 0.8–1.2)
Prothrombin Time: 11.7 seconds (ref 11.4–15.2)

## 2019-09-09 MED ORDER — FENTANYL CITRATE (PF) 100 MCG/2ML IJ SOLN
INTRAMUSCULAR | Status: AC | PRN
  Administered 2019-09-09: 50 ug via INTRAVENOUS

## 2019-09-09 MED ORDER — HYDROCODONE-ACETAMINOPHEN 5-325 MG PO TABS: 1.0000 | ORAL_TABLET | ORAL | Status: DC | PRN

## 2019-09-09 MED ORDER — MIDAZOLAM HCL 2 MG/2ML IJ SOLN
INTRAMUSCULAR | Status: AC | PRN
  Administered 2019-09-09: 1 mg via INTRAVENOUS

## 2019-09-09 MED ORDER — HYDRALAZINE HCL 20 MG/ML IJ SOLN
INTRAMUSCULAR | Status: AC
  Administered 2019-09-09: 10 mg via INTRAVENOUS
  Filled 2019-09-09: qty 1

## 2019-09-09 MED ORDER — MIDAZOLAM HCL 2 MG/2ML IJ SOLN
INTRAMUSCULAR | Status: AC
  Filled 2019-09-09: qty 2

## 2019-09-09 MED ORDER — GELATIN ABSORBABLE 12-7 MM EX MISC
CUTANEOUS | Status: AC
  Filled 2019-09-09: qty 1

## 2019-09-09 MED ORDER — FENTANYL CITRATE (PF) 100 MCG/2ML IJ SOLN
INTRAMUSCULAR | Status: AC
  Filled 2019-09-09: qty 2

## 2019-09-09 MED ORDER — SODIUM CHLORIDE 0.9 % IV SOLN
INTRAVENOUS | Status: AC | PRN
  Administered 2019-09-09: 10 mL/h via INTRAVENOUS

## 2019-09-09 MED ORDER — LIDOCAINE HCL (PF) 1 % IJ SOLN
INTRAMUSCULAR | Status: AC
  Filled 2019-09-09: qty 30

## 2019-09-09 MED ORDER — HYDRALAZINE HCL 20 MG/ML IJ SOLN: 10.0000 mg | Freq: Once | INTRAMUSCULAR | Status: AC

## 2019-09-09 NOTE — Progress Notes (Signed)
Terri Bee, Pa informed of BP

## 2019-09-09 NOTE — Procedures (Signed)
  Procedure: US core LLP renal biopsy   EBL:   minimal Complications:  none immediate  See full dictation in [facility] PACS.  D. Jaimen Melone MD Main # [phone] Pager  [phone]    

## 2019-09-09 NOTE — Progress Notes (Signed)
Discharge instructions reviewed with pt and her mother (via telephone) both voice understanding. 

## 2019-09-09 NOTE — Discharge Instructions (Signed)
Percutaneous Kidney Biopsy, Care After This sheet gives you information about how to care for yourself after your procedure. Your health care provider may also give you more specific instructions. If you have problems or questions, contact your health care provider. What can I expect after the procedure? After the procedure, it is common to have:  Pain or soreness near the biopsy site.  Pink or cloudy urine for 24 hours after the procedure. Follow these instructions at home: Activity  Return to your normal activities as told by your health care provider. Ask your health care provider what activities are safe for you.  If you were given a sedative during the procedure, it can affect you for several hours. Do not drive or operate machinery until your health care provider says that it is safe.  Do not lift anything that is heavier than 10 lb (4.5 kg), or the limit that you are told, until your health care provider says that it is safe.  Avoid activities that take a lot of effort (are strenuous) until your health care provider approves. Most people will have to wait 2 weeks before returning to activities such as exercise or sex. General instructions   Take over-the-counter and prescription medicines only as told by your health care provider.  You may eat and drink after your procedure. Follow instructions from your health care provider about eating or drinking restrictions.  Check your biopsy site every day for signs of infection. Check for: ? More redness, swelling, or pain. ? Fluid or blood. ? Warmth. ? Pus or a bad smell.  Keep all follow-up visits as told by your health care provider. This is important. Contact a health care provider if:  You have more redness, swelling, or pain around your biopsy site.  You have fluid or blood coming from your biopsy site.  Your biopsy site feels warm to the touch.  You have pus or a bad smell coming from your biopsy site.  You have blood  in your urine more than 24 hours after your procedure.  You have a fever. Get help right away if:  Your urine is dark red or brown.  You cannot urinate.  It burns when you urinate.  You feel dizzy or light-headed.  You have severe pain in your abdomen or side. Summary  After the procedure, it is common to have pain or soreness at the biopsy site and pink or cloudy urine for the first 24 hours.  Check your biopsy site each day for signs of infection, such as more redness, swelling, or pain; fluid, blood, pus or a bad smell coming from the biopsy site; or the biopsy site feeling warm to touch.  Return to your normal activities as told by your health care provider. This information is not intended to replace advice given to you by your health care provider. Make sure you discuss any questions you have with your health care provider. Document Revised: 11/27/2018 Document Reviewed: 11/27/2018 Elsevier Patient Education  2020 Elsevier Inc.  

## 2019-09-09 NOTE — H&P (Signed)
Chief Complaint: Patient was seen in consultation today for renal biopsy at the request of Sanford,Ryan B  Referring Physician(s): Sanford,Ryan B  Supervising Physician: Oley Balm  Patient Status: [facility] - Out-pt  History of Present Illness: Terri Bennett is a 24 y.o. female diagnosed with lupus and is referred for random renal biopsy as part of her workup. PMHx, meds, labs, imaging, allergies reviewed. Feels well, no recent fevers, chills, illness. No hx of HTN but BP a little elevated this am, possible anxiety. Has been NPO today as directed.    Past Medical History:  Diagnosis Date  . Chickenpox   . Heart murmur    as child  . Hypertension   . Lymphadenopathy of head and neck 02/05/2018  . Posterior cervical lymphadenopathy 02/28/2018    Past Surgical History:  Procedure Laterality Date  . LYMPH NODE BIOPSY Right 02/28/2018   Procedure: EXCISIONAL BIOPSY OF RIGHT POSTERIOR CERVICAL LYNPH NODE;  Surgeon: Osborn Coho, MD;  Location: [facility] OR;  Service: ENT;  Laterality: Right;    Allergies: Patient has no known allergies.  Medications: Prior to Admission medications   Medication Sig Start Date End Date Taking? Authorizing Provider  folic acid (FOLVITE) 1 MG tablet Take 1 mg by mouth daily.   Yes [provider]  hydroxychloroquine (PLAQUENIL) 200 MG tablet Take 400 mg by mouth daily.  12/07/18  Yes [provider]  Multiple Vitamins-Minerals (MULTIVITAMIN WITH MINERALS) tablet Take 2 tablets by mouth daily. Gummies   Yes [provider]     Family History  Problem Relation Age of Onset  . Breast cancer Maternal Grandmother   . Diabetes Unknown   . Lymphoma Mother     Social History   Socioeconomic History  . Marital status: Single    Spouse name: Not on file  . Number of children: Not on file  . Years of education: Not on file  . Highest education level: Not on file  Occupational History  . Not on file  Tobacco  Use  . Smoking status: Never Smoker  . Smokeless tobacco: Never Used  Substance and Sexual Activity  . Alcohol use: Yes    Alcohol/week: 0.0 standard drinks    Comment: social   . Drug use: No  . Sexual activity: Not on file  Other Topics Concern  . Not on file  Social History Narrative  . Not on file   Social Determinants of Health   Financial Resource Strain:   . Difficulty of Paying Living Expenses:   Food Insecurity:   . Worried About Programme researcher, broadcasting/film/video in the Last Year:   . Barista in the Last Year:   Transportation Needs:   . Freight forwarder (Medical):   Marland Kitchen Lack of Transportation (Non-Medical):   Physical Activity:   . Days of Exercise per Week:   . Minutes of Exercise per Session:   Stress:   . Feeling of Stress :   Social Connections:   . Frequency of Communication with Friends and Family:   . Frequency of Social Gatherings with Friends and Family:   . Attends Religious Services:   . Active Member of Clubs or Organizations:   . Attends Banker Meetings:   Marland Kitchen Marital Status:     Review of Systems: A 12 point ROS discussed and pertinent positives are indicated in the HPI above.  All other systems are negative.  Review of Systems  Vital Signs: BP (!) 133/100  Pulse 81   Temp 97.7 F (36.5 C) (Skin)   Resp 14   Ht 5\' 6"  (1.676 m)   Wt 88 kg   LMP 09/09/2019   SpO2 99%   BMI 31.31 kg/m   Physical Exam Constitutional:      Appearance: Normal appearance. She is not ill-appearing.  HENT:     Mouth/Throat:     Mouth: Mucous membranes are moist.     Pharynx: Oropharynx is clear.  Cardiovascular:     Rate and Rhythm: Normal rate and regular rhythm.     Heart sounds: Normal heart sounds.  Pulmonary:     Effort: Pulmonary effort is normal. No respiratory distress.     Breath sounds: Normal breath sounds.  Abdominal:     General: Abdomen is flat. There is no distension.     Palpations: Abdomen is soft.     Tenderness: There  is no abdominal tenderness.  Skin:    General: Skin is warm and dry.  Neurological:     General: No focal deficit present.     Mental Status: She is alert and oriented to person, place, and time.  Psychiatric:        Mood and Affect: Mood normal.        Thought Content: Thought content normal.        Judgment: Judgment normal.     Imaging: No results found.  Labs:  CBC: Recent Labs    09/09/19 0615  WBC 5.2  HGB 14.1  HCT 40.6  PLT 239    COAGS: Recent Labs    09/09/19 0615  INR 0.9    BMP: Recent Labs    12/19/18 0924  NA 138  K 4.4  CL 102  CO2 29  GLUCOSE 82  BUN 11  CALCIUM 9.2  CREATININE 0.62    LIVER FUNCTION TESTS: Recent Labs    12/19/18 0924  BILITOT 0.4  AST 45*  ALT 14  ALKPHOS 29*  PROT 6.9  ALBUMIN 3.7    TUMOR MARKERS: No results for input(s): AFPTM, CEA, CA199, CHROMGRNA in the last 8760 hours.  Assessment and Plan: Lupus Renal insufficiency For US guided random renal biopsy Labs ok Diastolic BP still a little high, will give 10 mg IV hydralazine prior to procedure. Risks and benefits of renal biopsy was discussed with the patient and/or patient's family including, but not limited to bleeding, infection, damage to adjacent structures or low yield requiring additional tests.  All of the questions were answered and there is agreement to proceed.  Consent signed and in chart.   Thank you for this interesting consult.  I greatly enjoyed meeting Terri Bennett and look forward to participating in their care.  A copy of this report was sent to the requesting provider on this date.  Electronically Signed: Ascencion Dike, PA-C 09/09/2019, 7:38 AM   I spent a total of 20 minutes in face to face in clinical consultation, greater than 50% of which was counseling/coordinating care for renal biopsy

## 2019-09-22 LAB — SURGICAL PATHOLOGY

## 2019-09-24 ENCOUNTER — Encounter (HOSPITAL_COMMUNITY): Payer: Self-pay

## 2019-10-19 ENCOUNTER — Encounter: Payer: Self-pay | Admitting: Family Medicine

## 2022-04-14 DIAGNOSIS — Z6829 Body mass index (BMI) 29.0-29.9, adult: Secondary | ICD-10-CM | POA: Diagnosis not present

## 2022-04-14 DIAGNOSIS — I10 Essential (primary) hypertension: Secondary | ICD-10-CM | POA: Diagnosis not present

## 2022-04-14 DIAGNOSIS — R300 Dysuria: Secondary | ICD-10-CM | POA: Diagnosis not present

## 2022-06-19 DIAGNOSIS — E663 Overweight: Secondary | ICD-10-CM | POA: Diagnosis not present

## 2022-06-21 DIAGNOSIS — I10 Essential (primary) hypertension: Secondary | ICD-10-CM | POA: Diagnosis not present

## 2022-06-21 DIAGNOSIS — M3214 Glomerular disease in systemic lupus erythematosus: Secondary | ICD-10-CM | POA: Diagnosis not present

## 2022-06-21 DIAGNOSIS — M329 Systemic lupus erythematosus, unspecified: Secondary | ICD-10-CM | POA: Diagnosis not present

## 2022-06-23 LAB — LAB REPORT - SCANNED
Creatinine, POC: 128.3 mg/dL
EGFR: 122
Protein/Creatinine Ratio: 523

## 2022-07-16 DIAGNOSIS — M329 Systemic lupus erythematosus, unspecified: Secondary | ICD-10-CM | POA: Diagnosis not present

## 2022-07-16 DIAGNOSIS — R801 Persistent proteinuria, unspecified: Secondary | ICD-10-CM | POA: Diagnosis not present

## 2022-07-16 DIAGNOSIS — I10 Essential (primary) hypertension: Secondary | ICD-10-CM | POA: Diagnosis not present

## 2022-07-16 DIAGNOSIS — R591 Generalized enlarged lymph nodes: Secondary | ICD-10-CM | POA: Diagnosis not present

## 2022-08-27 DIAGNOSIS — Z6829 Body mass index (BMI) 29.0-29.9, adult: Secondary | ICD-10-CM | POA: Diagnosis not present

## 2022-08-27 DIAGNOSIS — R350 Frequency of micturition: Secondary | ICD-10-CM | POA: Diagnosis not present

## 2022-08-27 DIAGNOSIS — I10 Essential (primary) hypertension: Secondary | ICD-10-CM | POA: Diagnosis not present

## 2022-09-13 DIAGNOSIS — Z01419 Encounter for gynecological examination (general) (routine) without abnormal findings: Secondary | ICD-10-CM | POA: Diagnosis not present

## 2022-09-19 DIAGNOSIS — Z01419 Encounter for gynecological examination (general) (routine) without abnormal findings: Secondary | ICD-10-CM | POA: Diagnosis not present

## 2022-09-19 DIAGNOSIS — N923 Ovulation bleeding: Secondary | ICD-10-CM | POA: Diagnosis not present

## 2022-11-20 DIAGNOSIS — M329 Systemic lupus erythematosus, unspecified: Secondary | ICD-10-CM | POA: Diagnosis not present

## 2023-01-15 DIAGNOSIS — R801 Persistent proteinuria, unspecified: Secondary | ICD-10-CM | POA: Diagnosis not present

## 2023-01-15 DIAGNOSIS — I10 Essential (primary) hypertension: Secondary | ICD-10-CM | POA: Diagnosis not present

## 2023-01-15 DIAGNOSIS — M329 Systemic lupus erythematosus, unspecified: Secondary | ICD-10-CM | POA: Diagnosis not present

## 2023-01-15 DIAGNOSIS — R591 Generalized enlarged lymph nodes: Secondary | ICD-10-CM | POA: Diagnosis not present

## 2023-01-29 ENCOUNTER — Ambulatory Visit: Payer: BC Managed Care – PPO | Admitting: Nurse Practitioner

## 2023-02-07 DIAGNOSIS — M3501 Sicca syndrome with keratoconjunctivitis: Secondary | ICD-10-CM | POA: Diagnosis not present

## 2023-02-07 DIAGNOSIS — B301 Conjunctivitis due to adenovirus: Secondary | ICD-10-CM | POA: Diagnosis not present

## 2023-04-25 DIAGNOSIS — I10 Essential (primary) hypertension: Secondary | ICD-10-CM | POA: Diagnosis not present

## 2023-04-25 DIAGNOSIS — M329 Systemic lupus erythematosus, unspecified: Secondary | ICD-10-CM | POA: Diagnosis not present

## 2023-04-25 DIAGNOSIS — L989 Disorder of the skin and subcutaneous tissue, unspecified: Secondary | ICD-10-CM | POA: Diagnosis not present

## 2023-04-25 DIAGNOSIS — N898 Other specified noninflammatory disorders of vagina: Secondary | ICD-10-CM | POA: Diagnosis not present

## 2023-05-13 DIAGNOSIS — R300 Dysuria: Secondary | ICD-10-CM | POA: Diagnosis not present

## 2023-05-13 DIAGNOSIS — R350 Frequency of micturition: Secondary | ICD-10-CM | POA: Diagnosis not present

## 2023-05-13 DIAGNOSIS — Z681 Body mass index (BMI) 19 or less, adult: Secondary | ICD-10-CM | POA: Diagnosis not present

## 2023-05-13 DIAGNOSIS — B3731 Acute candidiasis of vulva and vagina: Secondary | ICD-10-CM | POA: Diagnosis not present

## 2023-05-14 DIAGNOSIS — I10 Essential (primary) hypertension: Secondary | ICD-10-CM | POA: Diagnosis not present

## 2023-05-14 DIAGNOSIS — I129 Hypertensive chronic kidney disease with stage 1 through stage 4 chronic kidney disease, or unspecified chronic kidney disease: Secondary | ICD-10-CM | POA: Diagnosis not present

## 2023-07-16 DIAGNOSIS — R591 Generalized enlarged lymph nodes: Secondary | ICD-10-CM | POA: Diagnosis not present

## 2023-07-16 DIAGNOSIS — M329 Systemic lupus erythematosus, unspecified: Secondary | ICD-10-CM | POA: Diagnosis not present

## 2023-07-16 DIAGNOSIS — I10 Essential (primary) hypertension: Secondary | ICD-10-CM | POA: Diagnosis not present

## 2023-07-16 DIAGNOSIS — R801 Persistent proteinuria, unspecified: Secondary | ICD-10-CM | POA: Diagnosis not present

## 2023-07-17 DIAGNOSIS — I10 Essential (primary) hypertension: Secondary | ICD-10-CM | POA: Diagnosis not present

## 2023-07-17 DIAGNOSIS — M3214 Glomerular disease in systemic lupus erythematosus: Secondary | ICD-10-CM | POA: Diagnosis not present

## 2023-07-21 LAB — LAB REPORT - SCANNED
Creatinine, POC: 27.9 mg/dL
EGFR: 123

## 2023-10-09 ENCOUNTER — Institutional Professional Consult (permissible substitution) (HOSPITAL_BASED_OUTPATIENT_CLINIC_OR_DEPARTMENT_OTHER): Payer: Self-pay | Admitting: Family

## 2023-10-09 DIAGNOSIS — Z01419 Encounter for gynecological examination (general) (routine) without abnormal findings: Secondary | ICD-10-CM | POA: Diagnosis not present

## 2023-10-28 DIAGNOSIS — Z30430 Encounter for insertion of intrauterine contraceptive device: Secondary | ICD-10-CM | POA: Diagnosis not present

## 2023-10-31 ENCOUNTER — Ambulatory Visit: Payer: Self-pay | Admitting: Family Medicine

## 2023-10-31 ENCOUNTER — Encounter: Payer: Self-pay | Admitting: Family Medicine

## 2023-10-31 VITALS — BP 142/90 | HR 76 | Temp 98.4°F | Ht 66.0 in | Wt 171.6 lb

## 2023-10-31 DIAGNOSIS — M329 Systemic lupus erythematosus, unspecified: Secondary | ICD-10-CM

## 2023-10-31 DIAGNOSIS — I158 Other secondary hypertension: Secondary | ICD-10-CM

## 2023-10-31 DIAGNOSIS — Z7689 Persons encountering health services in other specified circumstances: Secondary | ICD-10-CM

## 2023-10-31 DIAGNOSIS — J302 Other seasonal allergic rhinitis: Secondary | ICD-10-CM

## 2023-10-31 DIAGNOSIS — Z8249 Family history of ischemic heart disease and other diseases of the circulatory system: Secondary | ICD-10-CM

## 2023-10-31 NOTE — Progress Notes (Signed)
 Established Patient Office Visit   Subjective  Patient ID: Terri Bennett, female    DOB: 10/22/1995  Age: 28 y.o. MRN: [id number]  No chief complaint on file.   Pt is a 28 year old female seen for establish care and follow-up on chronic conditions.  Patient followed by OB/GYN, Dr. Rutherford regularly.  SLE: dx'd with lupus in 2018.  Currently taking hydroxychloroquine and folic acid. She experiences occasional stiffness and aches, similar to arthritis, but her medication helps manage these symptoms.  Followed by rheumatology.  HTN: Fluctuating over the past year and a half. Lisinopril increased from 5 mg to 40 mg over the past few months, but her blood pressure remains high at times. She previously tried amlodipine  but developed an allergic reaction-rash. She is currently under the care of a nephrologist, Dr. Marlee at [facility] for her hypertension management.  She has made significant dietary changes, resulting in weight loss from 215-220 lbs to 170 lbs. She has cut back on juices and primarily drinks Diet Coke and espresso with a protein shake. She takes Tylenol  for pain and has stopped using Aleve and ibuprofen.  Family history of cardiovascular issues. Her brother passed away at 54 from a heart attack, her mother developed hypertension after his death, and her maternal grandfather had cardiovascular disease. Her twin sister experienced high blood pressure during pregnancy.  She has a history of a kidney biopsy and a biopsy of an inflamed lymph node. She wears contacts and has annual vision checks due to potential lupus-related vision issues.  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    Patient Active Problem List   Diagnosis Date Noted   Anxiety 12/19/2018   Systemic lupus erythematosus (HCC) 12/19/2018   Encounter for general  adult medical examination with abnormal findings 10/24/2015   HTN (hypertension) 10/20/2015   Obesity 10/20/2015   Past Medical History:  Diagnosis Date   Chickenpox    Heart murmur    as child   Hypertension    Lymphadenopathy of head and neck 02/05/2018   Posterior cervical lymphadenopathy 02/28/2018   Past Surgical History:  Procedure Laterality Date   LYMPH NODE BIOPSY Right 02/28/2018   Procedure: EXCISIONAL BIOPSY OF RIGHT POSTERIOR CERVICAL LYNPH NODE;  Surgeon: Mable Lenis, MD;  Location: [facility] OR;  Service: ENT;  Laterality: Right;   Social History   Tobacco Use   Smoking status: Never   Smokeless tobacco: Never  Vaping Use   Vaping status: Never Used  Substance Use Topics   Alcohol use: Yes    Alcohol/week: 1.0 standard drink of alcohol    Types: 1 Standard drinks or equivalent per week    Comment: social    Drug use: No   Family History  Problem Relation Age of Onset   Breast cancer Maternal Grandmother    Cancer Maternal Grandmother    Hypertension Maternal Grandmother    Diabetes Other    Lymphoma Mother    Cancer Mother    Early death Brother    Hypertension Maternal Aunt    No Known Allergies  ROS Negative unless stated above    Objective:     There were no vitals taken for this visit. BP Readings from Last 3 Encounters:  09/09/19 (!) 112/96  08/07/19 130/80  12/19/18 130/90   Wt Readings from Last 3 Encounters:  09/09/19 194 lb (88 kg)  08/07/19 198 lb 3.2 oz (89.9 kg)  12/19/18 190 lb 12.8 oz (86.5 kg)      Physical Exam Constitutional:      General: She is not in acute distress.    Appearance: Normal appearance.  HENT:     Head: Normocephalic and atraumatic.     Nose: Nose normal.     Mouth/Throat:     Mouth: Mucous membranes are moist.  Eyes:     Extraocular Movements: Extraocular movements intact.     Conjunctiva/sclera: Conjunctivae normal.  Cardiovascular:     Rate and Rhythm: Normal rate and regular rhythm.      Heart sounds: Normal heart sounds. No murmur heard.    No gallop.     Comments: No renal artery bruits noted bilaterally. Pulmonary:     Effort: Pulmonary effort is normal. No respiratory distress.     Breath sounds: Normal breath sounds. No wheezing, rhonchi or rales.  Musculoskeletal:     Cervical back: No tenderness.  Lymphadenopathy:     Cervical: No cervical adenopathy.  Skin:    General: Skin is warm and dry.  Neurological:     Mental Status: She is alert and oriented to person, place, and time.        10/31/2023    9:48 AM 12/19/2018    9:04 AM 05/06/2017    3:28 PM  Depression screen PHQ 2/9  Decreased Interest 0 0 0  Down, Depressed, Hopeless 0 0 0  PHQ - 2 Score 0 0 0  Altered sleeping 0    Tired, decreased energy 0    Change in appetite 0    Feeling bad or failure about yourself  0    Trouble concentrating 0    Moving slowly or fidgety/restless 0    Suicidal thoughts 0    PHQ-9 Score 0        10/31/2023    9:48 AM  GAD 7 : Generalized Anxiety Score  Nervous, Anxious, on Edge 1  Control/stop worrying 1  Worry too much - different things 1  Trouble relaxing 1  Restless 0  Easily annoyed or irritable 0  Afraid - awful might happen 0  Total GAD 7 Score 4  Anxiety Difficulty Not difficult at all     No results found for any visits on 10/31/23.    Assessment & Plan:   Systemic lupus erythematosus, unspecified SLE type, unspecified organ involvement status (HCC)  Encounter to establish care  Other secondary hypertension  Seasonal allergies   Systemic Lupus Erythematosus (SLE)   Managed with hydroxychloroquine. She experiences occasional stiffness and arthritis-like symptoms without functional impairment. Regular rheumatology follow-ups are maintained. Continue hydroxychloroquine and rheumatology follow-ups.  Hypertension   Exacerbated post-lupus diagnosis and managed with lisinopril 40 mg. Blood pressure is occasionally elevated. She reports  dizziness, possibly due to blood pressure fluctuations or dehydration. There is a family history of cardiovascular issues. A cardiology evaluation is considered. Continue lisinopril 40 mg. Nephrology follow-up is scheduled for next month. Cardiology referral is set for September.  Seasonal allergies controlled.  OTC allergy medication if needed.  Obtain records from previous providers.  Follow-up as needed.  Schedule a physical examination and provide lab scheduling and fasting information.  Clotilda JONELLE Single, MD

## 2023-10-31 NOTE — Patient Instructions (Signed)
 It was nice meeting you today.  We can set up a physical at your earliest convenience.

## 2023-11-05 DIAGNOSIS — K2970 Gastritis, unspecified, without bleeding: Secondary | ICD-10-CM | POA: Diagnosis not present

## 2023-11-06 DIAGNOSIS — H1131 Conjunctival hemorrhage, right eye: Secondary | ICD-10-CM | POA: Diagnosis not present

## 2023-11-22 ENCOUNTER — Ambulatory Visit: Payer: Self-pay | Admitting: Family Medicine

## 2023-11-22 ENCOUNTER — Ambulatory Visit (INDEPENDENT_AMBULATORY_CARE_PROVIDER_SITE_OTHER): Admitting: Family Medicine

## 2023-11-22 ENCOUNTER — Encounter: Payer: Self-pay | Admitting: Family Medicine

## 2023-11-22 VITALS — BP 140/94 | HR 66 | Temp 98.3°F | Ht 66.0 in | Wt 172.2 lb

## 2023-11-22 DIAGNOSIS — Z0000 Encounter for general adult medical examination without abnormal findings: Secondary | ICD-10-CM

## 2023-11-22 DIAGNOSIS — I158 Other secondary hypertension: Secondary | ICD-10-CM | POA: Diagnosis not present

## 2023-11-22 DIAGNOSIS — M329 Systemic lupus erythematosus, unspecified: Secondary | ICD-10-CM | POA: Diagnosis not present

## 2023-11-22 DIAGNOSIS — R011 Cardiac murmur, unspecified: Secondary | ICD-10-CM

## 2023-11-22 DIAGNOSIS — L509 Urticaria, unspecified: Secondary | ICD-10-CM | POA: Diagnosis not present

## 2023-11-22 DIAGNOSIS — H1131 Conjunctival hemorrhage, right eye: Secondary | ICD-10-CM

## 2023-11-22 LAB — FOLATE: Folate: >23.4 ng/mL (ref >5.9)

## 2023-11-22 NOTE — Progress Notes (Addendum)
 Established Patient Office Visit   Subjective  Patient ID: Terri Bennett, female    DOB: 20-Feb-1996  Age: 28 y.o. MRN: [id number]  Chief Complaint  Patient presents with   Annual Exam    Patient would like a referral to allergic reaction for Hives    Allergic Reaction    Pt is a 28 year old female seen for CPE.   Doing well overall.  Patient requesting referral to allergist.  Had an episode of urticaria while laying in bed.  Unsure if related to pineapple eating earlier in the day or wine 2 hours prior to episode.  Took Benadryl.  No issues since and no prior issues with those items.  Pt checking BP intermittently.  Typically 140s/90s at home.  Taking lisinopril 40 mg daily.  History of lupus.  Has upcoming appointment with nephrologist.  Patient mentions recent gastroenteritis with emesis causing conjunctival hemorrhage.  Eye improved but hemorrhage returned recently after patient had retching after eating something that did not agree with her.    Patient Active Problem List   Diagnosis Date Noted   Seasonal allergies 10/31/2023   Family history of MI (myocardial infarction) 10/31/2023   Anxiety 12/19/2018   Systemic lupus erythematosus (HCC) 12/19/2018   Encounter for general adult medical examination with abnormal findings 10/24/2015   HTN (hypertension) 10/20/2015   Obesity 10/20/2015   Past Medical History:  Diagnosis Date   Chickenpox    Heart murmur    as child   Hypertension    Lymphadenopathy of head and neck 02/05/2018   Posterior cervical lymphadenopathy 02/28/2018   Past Surgical History:  Procedure Laterality Date   LYMPH NODE BIOPSY Right 02/28/2018   Procedure: EXCISIONAL BIOPSY OF RIGHT POSTERIOR CERVICAL LYNPH NODE;  Surgeon: Mable Lenis, MD;  Location: [facility] OR;  Service: ENT;  Laterality: Right;   Social History   Tobacco Use   Smoking status: Never   Smokeless tobacco: Never  Vaping Use   Vaping status: Never Used  Substance Use  Topics   Alcohol use: Yes    Alcohol/week: 1.0 standard drink of alcohol    Types: 1 Standard drinks or equivalent per week    Comment: social    Drug use: No   Family History  Problem Relation Age of Onset   Breast cancer Maternal Grandmother    Cancer Maternal Grandmother    Hypertension Maternal Grandmother    Diabetes Other    Lymphoma Mother    Cancer Mother    Early death Brother    Hypertension Maternal Aunt    Allergies  Allergen Reactions   Norvasc  [Amlodipine ] Rash    rash    ROS Negative unless stated above    Objective:     BP (!) 140/94 (BP Location: Left Arm, Patient Position: Sitting, Cuff Size: Normal)   Pulse 66   Temp 98.3 F (36.8 C) (Oral)   Ht 5' 6 (1.676 m)   Wt 172 lb 3.2 oz (78.1 kg)   LMP 10/24/2023 (Exact Date)   SpO2 99%   BMI 27.79 kg/m  BP Readings from Last 3 Encounters:  11/22/23 (!) 140/94  10/31/23 (!) 142/90  09/09/19 (!) 112/96   Wt Readings from Last 3 Encounters:  11/22/23 172 lb 3.2 oz (78.1 kg)  10/31/23 171 lb 9.6 oz (77.8 kg)  09/09/19 194 lb (88 kg)      Physical Exam Constitutional:      Appearance: Normal appearance.  HENT:     Head: Normocephalic and atraumatic.  Right Ear: Tympanic membrane, ear canal and external ear normal.     Left Ear: Tympanic membrane, ear canal and external ear normal.     Nose: Nose normal.     Mouth/Throat:     Mouth: Mucous membranes are moist.     Pharynx: No oropharyngeal exudate or posterior oropharyngeal erythema.  Eyes:     General: No scleral icterus.    Extraocular Movements: Extraocular movements intact.     Conjunctiva/sclera:     Right eye: Hemorrhage present.     Pupils: Pupils are equal, round, and reactive to light.   Neck:     Thyroid : No thyromegaly.  Cardiovascular:     Rate and Rhythm: Normal rate and regular rhythm.     Pulses: Normal pulses.     Heart sounds: Murmur heard.     Systolic murmur is present with a grade of 1/6.     No friction rub.   Pulmonary:     Effort: Pulmonary effort is normal.     Breath sounds: Normal breath sounds. No wheezing, rhonchi or rales.  Abdominal:     General: Bowel sounds are normal.     Palpations: Abdomen is soft.     Tenderness: There is no abdominal tenderness.  Musculoskeletal:        General: No deformity. Normal range of motion.  Lymphadenopathy:     Cervical: No cervical adenopathy.  Skin:    General: Skin is warm and dry.     Findings: No lesion.  Neurological:     General: No focal deficit present.     Mental Status: She is alert and oriented to person, place, and time.  Psychiatric:        Mood and Affect: Mood normal.        Thought Content: Thought content normal.        11/22/2023   11:33 AM 10/31/2023    9:48 AM 12/19/2018    9:04 AM  Depression screen PHQ 2/9  Decreased Interest 0 0 0  Down, Depressed, Hopeless 0 0 0  PHQ - 2 Score 0 0 0  Altered sleeping 0 0   Tired, decreased energy 0 0   Change in appetite 0 0   Feeling bad or failure about yourself  0 0   Trouble concentrating 0 0   Moving slowly or fidgety/restless 0 0   Suicidal thoughts 0 0   PHQ-9 Score 0 0   Difficult doing work/chores Not difficult at all        11/22/2023   11:32 AM 10/31/2023    9:48 AM  GAD 7 : Generalized Anxiety Score  Nervous, Anxious, on Edge 0 1  Control/stop worrying 0 1  Worry too much - different things 0 1  Trouble relaxing 0 1  Restless 0 0  Easily annoyed or irritable 0 0  Afraid - awful might happen 0 0  Total GAD 7 Score 0 4  Anxiety Difficulty Not difficult at all Not difficult at all     No results found for any visits on 11/22/23.    Assessment & Plan:   Well adult exam -     CBC with Differential/Platelet; Future -     Hemoglobin A1c; Future -     Lipid panel; Future -     T4, free; Future -     TSH; Future  Systemic lupus erythematosus, unspecified SLE type, unspecified organ involvement status (HCC) -     CBC with Differential/Platelet;  Future -  Comprehensive metabolic panel with GFR; Future -     VITAMIN D 25 Hydroxy (Vit-D Deficiency, Fractures); Future -     Folate  Other secondary hypertension -     CBC with Differential/Platelet; Future -     Comprehensive metabolic panel with GFR; Future -     T4, free; Future -     TSH; Future  Urticaria -     Ambulatory referral to Allergy  Conjunctival hemorrhage of right eye  Murmur  Age-appropriate health screenings discussed.  Faint murmur heard on exam.  Hydration encouraged.  Continue to monitor.  Obtain labs.  Immunizations reviewed.  Consider pneumonia vaccine and influenza vaccine when available.  BP remained elevated.  Currently taking lisinopril 40 mg daily.  Discussed medication changes.  Patient wishes to wait at this time.  Has upcoming nephrology appointment.  Will continue to monitor BP at home.  Supportive care for conjunctival hemorrhage.  Return in about 3 months (around 02/22/2024), or if symptoms worsen or fail to improve.   Clotilda JONELLE Single, MD

## 2023-11-26 DIAGNOSIS — Z30431 Encounter for routine checking of intrauterine contraceptive device: Secondary | ICD-10-CM | POA: Diagnosis not present

## 2023-12-13 ENCOUNTER — Ambulatory Visit: Payer: Self-pay | Admitting: Cardiology

## 2023-12-20 DIAGNOSIS — R300 Dysuria: Secondary | ICD-10-CM | POA: Diagnosis not present

## 2023-12-20 DIAGNOSIS — R350 Frequency of micturition: Secondary | ICD-10-CM | POA: Diagnosis not present

## 2024-01-01 DIAGNOSIS — R350 Frequency of micturition: Secondary | ICD-10-CM | POA: Diagnosis not present

## 2024-01-01 DIAGNOSIS — Z6827 Body mass index (BMI) 27.0-27.9, adult: Secondary | ICD-10-CM | POA: Diagnosis not present

## 2024-01-01 DIAGNOSIS — R300 Dysuria: Secondary | ICD-10-CM | POA: Diagnosis not present

## 2024-01-07 DIAGNOSIS — M3214 Glomerular disease in systemic lupus erythematosus: Secondary | ICD-10-CM | POA: Diagnosis not present

## 2024-01-07 DIAGNOSIS — I10 Essential (primary) hypertension: Secondary | ICD-10-CM | POA: Diagnosis not present

## 2024-01-10 ENCOUNTER — Encounter: Payer: Self-pay | Admitting: Cardiology

## 2024-01-10 ENCOUNTER — Ambulatory Visit: Payer: Self-pay | Admitting: Cardiology

## 2024-01-10 VITALS — BP 148/96 | HR 80 | Ht 66.0 in | Wt 173.2 lb

## 2024-01-10 DIAGNOSIS — Z13220 Encounter for screening for lipoid disorders: Secondary | ICD-10-CM | POA: Diagnosis not present

## 2024-01-10 DIAGNOSIS — Z131 Encounter for screening for diabetes mellitus: Secondary | ICD-10-CM

## 2024-01-10 DIAGNOSIS — I7090 Unspecified atherosclerosis: Secondary | ICD-10-CM | POA: Diagnosis not present

## 2024-01-10 DIAGNOSIS — I119 Hypertensive heart disease without heart failure: Secondary | ICD-10-CM

## 2024-01-10 DIAGNOSIS — I701 Atherosclerosis of renal artery: Secondary | ICD-10-CM

## 2024-01-10 MED ORDER — OLMESARTAN MEDOXOMIL 20 MG PO TABS: 20.0000 mg | ORAL_TABLET | Freq: Every day | ORAL | 3 refills | Status: DC

## 2024-01-10 NOTE — Progress Notes (Signed)
 Cardio-Obstetrics Clinic  New Evaluation  Date:  01/10/2024   ID:  Terri Bennett, Terri Bennett 08/07/1995, MRN [id number]  PCP:  Mercer Clotilda SAUNDERS, MD   [facility] Providers Cardiologist:  Dub Huntsman, DO  Electrophysiologist:  None       Referring MD: Rutherford Gain, MD   Chief Complaint:  I am doing well  History of Present Illness:    Terri Bennett is a 28 y.o. female [No obstetric history on file.] who is being seen today for the evaluation of chronic hypertension  at the request of Rutherford Gain, MD.   e with hypertension and lupus who presents for management of her blood pressure in preparation for future pregnancy.  Hypertension was diagnosed shortly after her lupus diagnosis in 2019-2020. Blood pressure readings fluctuate between 140 and 160 mmHg. She initially used amlodipine  but switched to lisinopril due to an allergic reaction. Despite dose adjustments, blood pressure remains elevated. She monitors her blood pressure daily at home.  Lupus affects her kidneys, but she does not have kidney disease. Her nephrologist describes her kidney function as stable. A kidney biopsy was performed in 2021, and the last kidney ultrasound was around the same time.  Family history includes her brother's death at age 40. She is considering pregnancy in the next two to three years and aims to optimize her health before conceiving. She is currently taking lisinopril.   Prior CV Studies Reviewed: The following studies were reviewed today:   Past Medical History:  Diagnosis Date   Chickenpox    Heart murmur    as child   Hypertension    Lymphadenopathy of head and neck 02/05/2018   Posterior cervical lymphadenopathy 02/28/2018    Past Surgical History:  Procedure Laterality Date   LYMPH NODE BIOPSY Right 02/28/2018   Procedure: EXCISIONAL BIOPSY OF RIGHT POSTERIOR CERVICAL LYNPH NODE;  Surgeon: Mable Lenis, MD;  Location: [facility] OR;  Service: ENT;   Laterality: Right;      OB History   No obstetric history on file.         Current Medications: Current Meds  Medication Sig   folic acid  (FOLVITE ) 1 MG tablet Take 1 mg by mouth daily.   hydroxychloroquine (PLAQUENIL) 200 MG tablet Take 400 mg by mouth daily.    lisinopril (ZESTRIL) 40 MG tablet    Multiple Vitamins-Minerals (MULTIVITAMIN WITH MINERALS) tablet Take 2 tablets by mouth daily. Gummies     Allergies:   Norvasc  [amlodipine ]   Social History   Socioeconomic History   Marital status: Single    Spouse name: Not on file   Number of children: Not on file   Years of education: Not on file   Highest education level: Master's degree (e.g., MA, MS, MEng, MEd, MSW, MBA)  Occupational History   Not on file  Tobacco Use   Smoking status: Never   Smokeless tobacco: Never  Vaping Use   Vaping status: Never Used  Substance and Sexual Activity   Alcohol use: Yes    Alcohol/week: 1.0 standard drink of alcohol    Types: 1 Standard drinks or equivalent per week    Comment: social    Drug use: No   Sexual activity: Not Currently    Birth control/protection: I.U.D.  Other Topics Concern   Not on file  Social History Narrative   Not on file   Social Drivers of Health   Financial Resource Strain: Low Risk  (10/30/2023)   Overall Financial Resource Strain (CARDIA)  Difficulty of Paying Living Expenses: Not very hard  Food Insecurity: No Food Insecurity (10/30/2023)   Hunger Vital Sign    Worried About Running Out of Food in the Last Year: Never true    Ran Out of Food in the Last Year: Never true  Transportation Needs: No Transportation Needs (10/30/2023)   PRAPARE - Administrator, Civil Service (Medical): No    Lack of Transportation (Non-Medical): No  Physical Activity: Insufficiently Active (10/30/2023)   Exercise Vital Sign    Days of Exercise per Week: 4 days    Minutes of Exercise per Session: 20 min  Stress: Stress Concern Present (10/30/2023)    [employer] of Occupational Health - Occupational Stress Questionnaire    Feeling of Stress: Rather much  Social Connections: Moderately Isolated (10/30/2023)   Social Connection and Isolation Panel    Frequency of Communication with Friends and Family: Three times a week    Frequency of Social Gatherings with Friends and Family: Twice a week    Attends Religious Services: 1 to 4 times per year    Active Member of [employer] or Organizations: No    Attends Engineer, structural: Not on file    Marital Status: Never married      Family History  Problem Relation Age of Onset   Breast cancer Maternal Grandmother    Cancer Maternal Grandmother    Hypertension Maternal Grandmother    Diabetes Other    Lymphoma Mother    Cancer Mother    Early death Brother    Hypertension Maternal Aunt       ROS:   Please see the history of present illness.     All other systems reviewed and are negative.   Labs/EKG Reviewed:    EKG:  None   Recent Labs: No results found for requested labs within last 365 days.   Recent Lipid Panel Lab Results  Component Value Date/Time   CHOL 139 12/19/2018 09:24 AM   TRIG 68.0 12/19/2018 09:24 AM   HDL 49.30 12/19/2018 09:24 AM   CHOLHDL 3 12/19/2018 09:24 AM   LDLCALC 76 12/19/2018 09:24 AM    Physical Exam:    VS:  BP (!) 160/100 (BP Location: Left Arm, Patient Position: Sitting, Cuff Size: Normal)   Pulse 80   Ht 5' 6 (1.676 m)   Wt 173 lb 3.2 oz (78.6 kg)   SpO2 97%   BMI 27.96 kg/m     Wt Readings from Last 3 Encounters:  01/10/24 173 lb 3.2 oz (78.6 kg)  11/22/23 172 lb 3.2 oz (78.1 kg)  10/31/23 171 lb 9.6 oz (77.8 kg)     GEN:  Well nourished, well developed in no acute distress HEENT: Normal NECK: No JVD; No carotid bruits LYMPHATICS: No lymphadenopathy CARDIAC: RRR, no murmurs, rubs, gallops RESPIRATORY:  Clear to auscultation without rales, wheezing or rhonchi  ABDOMEN: Soft, non-tender,  non-distended MUSCULOSKELETAL:  No edema; No deformity  SKIN: Warm and dry NEUROLOGIC:  Alert and oriented x 3 PSYCHIATRIC:  Normal affect    Risk Assessment/Risk Calculators:                 ASSESSMENT & PLAN:    Hypertension Hypertension persists despite lisinopril. Allergic to amlodipine . Goal: BP <130 mmHg, especially pre-pregnancy. Olmesartan considered for future pregnancy. Echocardiogram needed for cardiac assessment. Screen for hyperlipidemia and calcium score due to family history. Kidney ultrasound to rule out renal artery stenosis. - Discontinue lisinopril. - Initiate olmesartan  20 mg, increase to 40 mg if needed. - Consider adding hydrochlorothiazide if necessary. - Order echocardiogram. - Order lipid profile. - Order coronary CTA for calcium score. - Order kidney ultrasound. - Monitor blood pressure daily, report weekly. - Follow-up with pharmacist in 4-6 weeks for olmesartan efficacy. - Follow-up in 16 weeks.    There are no Patient Instructions on file for this visit.   Dispo:  No follow-ups on file.   Medication Adjustments/Labs and Tests Ordered: Current medicines are reviewed at length with the patient today.  Concerns regarding medicines are outlined above.  Tests Ordered: No orders of the defined types were placed in this encounter.  Medication Changes: No orders of the defined types were placed in this encounter.

## 2024-01-10 NOTE — Patient Instructions (Signed)
 Medication Instructions:  Your physician has recommended you make the following change in your medication:  STOP: Lisinopril START: Olmesartan 20 mg once daily   Please take your blood pressure daily for 1 week and send in a MyChart message. Please include heart rates. (One message at the end of the week).   HOW TO TAKE YOUR BLOOD PRESSURE: Rest 5 minutes before taking your blood pressure. Don't smoke or drink caffeinated beverages for at least 30 minutes before. Take your blood pressure before (not after) you eat. Sit comfortably with your back supported and both feet on the floor (don't cross your legs). Elevate your arm to heart level on a table or a desk. Use the proper sized cuff. It should fit smoothly and snugly around your bare upper arm. There should be enough room to slip a fingertip under the cuff. The bottom edge of the cuff should be 1 inch above the crease of the elbow. Ideally, take 3 measurements at one sitting and record the average.  *If you need a refill on your cardiac medications before your next appointment, please call your pharmacy*  Lab Work: Lipids, Lp(a), HgbA1c at [address] first floor.   If you have labs (blood work) drawn today and your tests are completely normal, you will receive your results only by: MyChart Message (if you have MyChart) OR A paper copy in the mail If you have any lab test that is abnormal or we need to change your treatment, we will call you to review the results.  Testing/Procedures: Your physician has requested that you have an echocardiogram. Echocardiography is a painless test that uses sound waves to create images of your heart. It provides your doctor with information about the size and shape of your heart and how well your heart's chambers and valves are working. This procedure takes approximately one hour. There are no restrictions for this procedure. Please do NOT wear cologne, perfume, aftershave, or lotions  (deodorant is allowed). Please arrive 15 minutes prior to your appointment time.  Please note: We ask at that you not bring children with you during ultrasound (echo/ vascular) testing. Due to room size and safety concerns, children are not allowed in the ultrasound rooms during exams. Our front office staff cannot provide observation of children in our lobby area while testing is being conducted. An adult accompanying a patient to their appointment will only be allowed in the ultrasound room at the discretion of the ultrasound technician under special circumstances. We apologize for any inconvenience.  Your physician has requested that you have a renal artery duplex. During this test, an ultrasound is used to evaluate blood flow to the kidneys. Allow one hour for this exam. Do not eat after midnight the day before and avoid carbonated beverages. Take your medications as you usually do.  Dr. Sheena has ordered a CT coronary calcium score.   Test locations:  [facility] at [facility]  [facility] at [facility]  This is $99 out of pocket.   Coronary CalciumScan A coronary calcium scan is an imaging test used to look for deposits of calcium and other fatty materials (plaques) in the inner lining of the blood vessels of the heart (coronary arteries). These deposits of calcium and plaques can partly clog and narrow the coronary arteries without producing any symptoms or warning signs. This puts a person at risk for a heart attack. This test can detect  these deposits before symptoms develop. Tell a health care provider about: Any allergies you have. All medicines you are taking, including vitamins, herbs, eye drops, creams, and over-the-counter medicines. Any problems you or family members have had with anesthetic medicines. Any blood disorders you have. Any surgeries you have had. Any medical  conditions you have. Whether you are pregnant or may be pregnant. What are the risks? Generally, this is a safe procedure. However, problems may occur, including: Harm to a pregnant woman and her unborn baby. This test involves the use of radiation. Radiation exposure can be dangerous to a pregnant woman and her unborn baby. If you are pregnant, you generally should not have this procedure done. Slight increase in the risk of cancer. This is because of the radiation involved in the test. What happens before the procedure? No preparation is needed for this procedure. What happens during the procedure? You will undress and remove any jewelry around your neck or chest. You will put on a hospital gown. Sticky electrodes will be placed on your chest. The electrodes will be connected to an electrocardiogram (ECG) machine to record a tracing of the electrical activity of your heart. A CT scanner will take pictures of your heart. During this time, you will be asked to lie still and hold your breath for 2-3 seconds while a picture of your heart is being taken. The procedure may vary among health care providers and hospitals. What happens after the procedure? You can get dressed. You can return to your normal activities. It is up to you to get the results of your test. Ask your health care provider, or the department that is doing the test, when your results will be ready. Summary A coronary calcium scan is an imaging test used to look for deposits of calcium and other fatty materials (plaques) in the inner lining of the blood vessels of the heart (coronary arteries). Generally, this is a safe procedure. Tell your health care provider if you are pregnant or may be pregnant. No preparation is needed for this procedure. A CT scanner will take pictures of your heart. You can return to your normal activities after the scan is done. This information is not intended to replace advice given to you by your  health care provider. Make sure you discuss any questions you have with your health care provider. Document Released: 09/22/2007 Document Revised: 02/13/2016 Document Reviewed: 02/13/2016 Elsevier Interactive Patient Education  2017 [employer].   Follow-Up: At [facility], you and your health needs are our priority.  As part of our continuing mission to provide you with exceptional heart care, our providers are all part of one team.  This team includes your primary Cardiologist (physician) and Advanced Practice Providers or APPs (Physician Assistants and Nurse Practitioners) who all work together to provide you with the care you need, when you need it.  Your next appointment:   16 week(s)  Provider:   Kardie Tobb, DO

## 2024-01-14 DIAGNOSIS — M329 Systemic lupus erythematosus, unspecified: Secondary | ICD-10-CM | POA: Diagnosis not present

## 2024-01-14 DIAGNOSIS — R801 Persistent proteinuria, unspecified: Secondary | ICD-10-CM | POA: Diagnosis not present

## 2024-01-14 DIAGNOSIS — I10 Essential (primary) hypertension: Secondary | ICD-10-CM | POA: Diagnosis not present

## 2024-01-14 DIAGNOSIS — R591 Generalized enlarged lymph nodes: Secondary | ICD-10-CM | POA: Diagnosis not present

## 2024-01-20 ENCOUNTER — Encounter: Payer: Self-pay | Admitting: Cardiology

## 2024-01-24 ENCOUNTER — Ambulatory Visit: Admitting: Pharmacist

## 2024-01-24 VITALS — BP 137/85 | HR 81 | Wt 172.9 lb

## 2024-01-24 DIAGNOSIS — I10 Essential (primary) hypertension: Secondary | ICD-10-CM | POA: Diagnosis not present

## 2024-01-24 DIAGNOSIS — Z131 Encounter for screening for diabetes mellitus: Secondary | ICD-10-CM | POA: Diagnosis not present

## 2024-01-24 DIAGNOSIS — Z13220 Encounter for screening for lipoid disorders: Secondary | ICD-10-CM | POA: Diagnosis not present

## 2024-01-24 MED ORDER — OLMESARTAN MEDOXOMIL 40 MG PO TABS: 40.0000 mg | ORAL_TABLET | Freq: Every day | ORAL | 1 refills | Status: AC

## 2024-01-24 NOTE — Progress Notes (Signed)
 Patient ID: Terri Bennett                 DOB: 06-16-1995                      MRN: [id number]     HPI: Terri Bennett is a 28 y.o. female referred to Cardio OB clinic.  PMH is significant for HTN, lupus, and obesity.  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 Has been going to gym 3-4x a week. Denies stress, anxiety, and worry.  Does not add salt to foods. Was previously drinking diet sodas but has discontinued. Now drinks one cup of coffee and energy drinks at the gym.  Currently managed on olmesartan  20mg  daily with no reported adverse effects.  Current HTN meds:  Olmesartan  20mg  once a day   Wt Readings from Last 3 Encounters:  01/10/24 173 lb 3.2 oz (78.6 kg)  11/22/23 172 lb 3.2 oz (78.1 kg)  10/31/23 171 lb 9.6 oz (77.8 kg)   BP Readings from Last 3 Encounters:  01/10/24 (!) 148/96  11/22/23 (!) 140/94  10/31/23 (!) 142/90   Pulse Readings from Last 3 Encounters:  01/10/24 80  11/22/23 66  10/31/23 76    Renal function: CrCl cannot be calculated (Patient's most recent lab result is older than the maximum 21 days allowed.).  Past Medical History:  Diagnosis Date   Chickenpox    Heart murmur    as child   Hypertension    Lymphadenopathy of head and neck 02/05/2018   Posterior cervical lymphadenopathy 02/28/2018    Current Outpatient Medications on File Prior to Visit  Medication Sig Dispense Refill   folic acid  (FOLVITE ) 1 MG tablet Take 1 mg by mouth daily.     hydroxychloroquine (PLAQUENIL) 200 MG tablet Take 400 mg by mouth daily.      Multiple Vitamins-Minerals (MULTIVITAMIN WITH MINERALS) tablet Take 2 tablets by mouth daily. Gummies     olmesartan  (BENICAR ) 20 MG tablet Take 1 tablet (20 mg total) by mouth daily. 90 tablet 3   No current facility-administered medications on file prior to visit.    Allergies  Allergen Reactions   Norvasc  [Amlodipine ] Rash    rash     Assessment/Plan:  1. Hypertension -   Patient BP has improved sligthly to 137/85. Encouraged continued exercise and diet changes. Advised to check nutrition info on energy drinks from the gym as they may have excess sugar or caffeine. Will increase olmesartan  to 40mg  once daily. Recheck in 4 weeks  Increase olmesartan  to 40mg  daily Recheck in 4 weeks  Medford Bolk, PharmD, BCACP, CDCES, CPP [facility] [address], [place], [place] [address] Phone: [phone]; Fax: [phone] 03/13/2024 2:15 PM

## 2024-01-24 NOTE — Patient Instructions (Signed)
 Terri Bennett

## 2024-01-29 ENCOUNTER — Ambulatory Visit
Admission: RE | Admit: 2024-01-29 | Discharge: 2024-01-29 | Disposition: A | Discharge status: Home / Self Care | Payer: Self-pay | Source: Ambulatory Visit | Attending: Cardiology | Admitting: Cardiology

## 2024-01-29 DIAGNOSIS — I7090 Unspecified atherosclerosis: Secondary | ICD-10-CM | POA: Insufficient documentation

## 2024-01-30 ENCOUNTER — Ambulatory Visit: Payer: Self-pay | Admitting: Cardiology

## 2024-02-12 ENCOUNTER — Ambulatory Visit (HOSPITAL_BASED_OUTPATIENT_CLINIC_OR_DEPARTMENT_OTHER)
Admission: RE | Admit: 2024-02-12 | Discharge: 2024-02-12 | Disposition: A | Discharge status: Home / Self Care | Source: Ambulatory Visit | Attending: Cardiology | Admitting: Cardiology

## 2024-02-12 ENCOUNTER — Ambulatory Visit (HOSPITAL_COMMUNITY)
Admission: RE | Admit: 2024-02-12 | Discharge: 2024-02-12 | Disposition: A | Discharge status: Home / Self Care | Source: Ambulatory Visit | Attending: Cardiology | Admitting: Cardiology

## 2024-02-12 DIAGNOSIS — I119 Hypertensive heart disease without heart failure: Secondary | ICD-10-CM

## 2024-02-12 DIAGNOSIS — I701 Atherosclerosis of renal artery: Secondary | ICD-10-CM

## 2024-02-12 LAB — ECHOCARDIOGRAM COMPLETE
AR max vel: 2.91 cm2
AV Area VTI: 2.72 cm2
AV Area mean vel: 2.77 cm2
AV Mean grad: 4 mmHg
AV Peak grad: 7.5 mmHg
Ao pk vel: 1.37 m/s
Area-P 1/2: 3.85 cm2
MV M vel: 2.64 m/s
MV Peak grad: 27.9 mmHg
S' Lateral: 2.74 cm

## 2024-02-21 ENCOUNTER — Ambulatory Visit: Admitting: Pharmacist

## 2024-03-13 ENCOUNTER — Ambulatory Visit: Admitting: Pharmacist

## 2024-03-13 ENCOUNTER — Encounter: Payer: Self-pay | Admitting: Pharmacist

## 2024-03-13 VITALS — BP 172/98 | HR 74

## 2024-03-13 DIAGNOSIS — I119 Hypertensive heart disease without heart failure: Secondary | ICD-10-CM | POA: Diagnosis not present

## 2024-03-13 MED ORDER — HYDROCHLOROTHIAZIDE 12.5 MG PO CAPS: 12.5000 mg | ORAL_CAPSULE | Freq: Every day | ORAL | 3 refills | Status: DC

## 2024-03-13 NOTE — Patient Instructions (Signed)
 SABRA

## 2024-03-13 NOTE — Addendum Note (Signed)
 Addended by: DARRELL BRUCKNER on: 03/13/2024 04:02 PM   Modules accepted: Orders

## 2024-03-13 NOTE — Progress Notes (Signed)
 Patient ID: Terri Bennett                 DOB: [birth year]                      MRN: [id number]     HPI: Terri Bennett is a 28 y.o. female referred to Cardio OB clinic.  PMH is significant for HTN, lupus, and obesity.  Olmesartan  increased to 40mg  at last visit. Reports no adverse effects.  Had a [employer] meal for lunch this afternoon. Went to the gym in the morning. Has not checked her BP at home recently. Denies CP, SOB, LEE.  Current HTN meds:  Olmesartan  40mg  once a day   Wt Readings from Last 3 Encounters:  01/10/24 173 lb 3.2 oz (78.6 kg)  11/22/23 172 lb 3.2 oz (78.1 kg)  10/31/23 171 lb 9.6 oz (77.8 kg)   BP Readings from Last 3 Encounters:  01/10/24 (!) 148/96  11/22/23 (!) 140/94  10/31/23 (!) 142/90   Pulse Readings from Last 3 Encounters:  01/10/24 80  11/22/23 66  10/31/23 76    Renal function: CrCl cannot be calculated (Patient's most recent lab result is older than the maximum 21 days allowed.).  Past Medical History:  Diagnosis Date   Chickenpox    Heart murmur    as child   Hypertension    Lymphadenopathy of head and neck 02/05/2018   Posterior cervical lymphadenopathy 02/28/2018    Current Outpatient Medications on File Prior to Visit  Medication Sig Dispense Refill   folic acid  (FOLVITE ) 1 MG tablet Take 1 mg by mouth daily.     hydroxychloroquine (PLAQUENIL) 200 MG tablet Take 400 mg by mouth daily.      Multiple Vitamins-Minerals (MULTIVITAMIN WITH MINERALS) tablet Take 2 tablets by mouth daily. Gummies     olmesartan  (BENICAR ) 20 MG tablet Take 1 tablet (20 mg total) by mouth daily. 90 tablet 3   No current facility-administered medications on file prior to visit.    Allergies  Allergen Reactions   Norvasc  [Amlodipine ] Rash    rash     Assessment/Plan:  1. Hypertension -  Patient BP elevated today at 163/113 and rechecked at 172/98 (manually taken). Both above goal of <130/80 due to renal artery stenosis. Recommend  she begin checking BP at home more frequently and limit sodium intake. Will start hydrochlorothiazide  and check BMP in 1-2 weeks. Recheck in clinic in 4 weeks.   Continue olmesartan  to 40mg  daily Start hydrochlorothiazide  12.5mg  daily Check BMP in 1-2 weeks Recheck in 4 weeks  Medford Bolk, PharmD, [place], CDCES, CPP [facility] [address] [place], [place] [address] Phone: [phone]; Fax: [phone] 03/13/2024 2:15 PM

## 2024-03-16 ENCOUNTER — Encounter: Payer: Self-pay | Admitting: Pharmacist

## 2024-03-17 ENCOUNTER — Telehealth: Payer: Self-pay | Admitting: Pharmacy Technician

## 2024-03-17 ENCOUNTER — Other Ambulatory Visit (HOSPITAL_COMMUNITY): Payer: Self-pay

## 2024-03-17 NOTE — Telephone Encounter (Signed)
 [facility] in [place] filled hydrochlorothiazide  on 11/28/23 (picked up 12/02/23) and 02/26/24 (picked up 02/27/24) but [facility] [place] only [facility] (this is her twin). However, somehow this went on this patient's insurance-even though they have two different insurances. This [facility] only [facility] and did not start hydrochlorothiazide  until 03/13/24. The office did not send a prescription for this patient until 03/13/24 and the prescription only went to [facility]. [facility] [place] [facility] fills off this patient's insurance and applied to her twin's insurance (the correct patient). I called [facility] and they are now filling hydrochlorothiazide . I called the patient and made her aware.

## 2024-03-20 ENCOUNTER — Other Ambulatory Visit (HOSPITAL_COMMUNITY): Payer: Self-pay

## 2024-04-01 ENCOUNTER — Telehealth: Payer: Self-pay

## 2024-04-01 NOTE — Progress Notes (Signed)
" ° °  04/01/2024  Patient ID: Terri Bennett, female   DOB: 1995-09-20, 28 y.o.   MRN: [id number]  Pharmacy Quality Measure Review  This patient is appearing on a report for being at risk of failing the Controlling Blood Pressure measure this calendar year.   Last documented BP 172/98 on 03/13/24  Spoke with patient via telephone. Has since started hydrochlorothiazide  in addition to Olmesartan . Reports she is checking her BP at home but does not have those readings available to her at this time.  Is aware to go get lab work done and reminded of f/u appt in Jan with HTN PharmD  Jon VEAR Lindau, PharmD Clinical Pharmacist [phone]     "

## 2024-04-24 ENCOUNTER — Ambulatory Visit: Admitting: Pharmacist

## 2024-04-24 VITALS — BP 150/90 | HR 84

## 2024-04-24 DIAGNOSIS — I10 Essential (primary) hypertension: Secondary | ICD-10-CM

## 2024-04-24 MED ORDER — HYDROCHLOROTHIAZIDE 25 MG PO TABS: 25.0000 mg | ORAL_TABLET | Freq: Every day | ORAL | 3 refills | Status: AC

## 2024-04-24 NOTE — Progress Notes (Signed)
 Patient ID: Terri Bennett                 DOB: 1996/02/22                      MRN: [id number]     HPI: Terri Bennett is a 29 y.o. female referred by Dr. Sheena to HTN clinic. PMH is significant for lupus, HTN and renal artery stenosis.  Patient was hypertensive at last Cardio OB office visit and low dose hydrochlorothiazide  was started. Remains on olmesartan  40mg  daily.  Patient presents today in good spirits. Has been checking BP at home but did not bring cuff or log. From memory she reports readings of 147/87, 127/86 and 129/unknown.  Has noticed increased urination since starting hydrochlorothiazide . Lab work showed slightly elevated BUN/Cr ratio. Unable to take amlodipine .  Continues to exercise daily and go to gym.    Current HTN meds:  Olmesartan  40mg  daily Hydrochlorothiazide  12.5mg   Previously tried: Amlodipine  (rash) BP goal: <130/80   Wt Readings from Last 3 Encounters:  01/24/24 172 lb 14.4 oz (78.4 kg)  01/10/24 173 lb 3.2 oz (78.6 kg)  11/22/23 172 lb 3.2 oz (78.1 kg)   BP Readings from Last 3 Encounters:  03/13/24 (!) 172/98  01/24/24 137/85  01/10/24 (!) 148/96   Pulse Readings from Last 3 Encounters:  03/13/24 74  01/24/24 81  01/10/24 80    Renal function: CrCl cannot be calculated (Patient's most recent lab result is older than the maximum 21 days allowed.).  Past Medical History:  Diagnosis Date   Chickenpox    Heart murmur    as child   Hypertension    Lymphadenopathy of head and neck 02/05/2018   Posterior cervical lymphadenopathy 02/28/2018    Medications Ordered Prior to Encounter[1]  Allergies[2]   Assessment/Plan:  1. Hypertension -  Patient BP 150/90 in room which is above goal of <130/80 but this has improved since starting hydrochlorothiazide . Advised to try to increase water intake and stay hydrated. Will increase hydrochlorothiazide  to 25mg  once daily and recheck BMP in 1-2 weeks.  F/u in 4 weeks  Continue  olmesartan  40mg  daily Increase hydrochlorothiazide  to 25mg  daily Check BMP in 1-2 weeks F/u in 4 weeks  Medford Bolk, PharmD, BCACP, CDCES, CPP [facility] [address] [place], [place] [address] Phone: [phone]; Fax: [phone] 04/26/2024 12:25 PM      [1]  Current Outpatient Medications on File Prior to Visit  Medication Sig Dispense Refill   folic acid  (FOLVITE ) 1 MG tablet Take 1 mg by mouth daily.     hydrochlorothiazide  (MICROZIDE ) 12.5 MG capsule Take 1 capsule (12.5 mg total) by mouth daily. 30 capsule 3   Multiple Vitamins-Minerals (MULTIVITAMIN WITH MINERALS) tablet Take 2 tablets by mouth daily. Gummies     olmesartan  (BENICAR ) 40 MG tablet Take 1 tablet (40 mg total) by mouth daily. 90 tablet 1   No current facility-administered medications on file prior to visit.  [2]  Allergies Allergen Reactions   Norvasc  [Amlodipine ] Rash    rash

## 2024-04-24 NOTE — Patient Instructions (Signed)
 SABRA

## 2024-05-22 ENCOUNTER — Ambulatory Visit: Admitting: Pharmacist
